# Patient Record
Sex: Male | Born: 1960
Health system: Southern US, Community
[De-identification: ages and names within clinical notes are randomized; demographics above are authoritative.]

## PROBLEM LIST (undated history)

## (undated) DIAGNOSIS — E7800 Pure hypercholesterolemia, unspecified: Secondary | ICD-10-CM

## (undated) DIAGNOSIS — I10 Essential (primary) hypertension: Secondary | ICD-10-CM

## (undated) DIAGNOSIS — K219 Gastro-esophageal reflux disease without esophagitis: Secondary | ICD-10-CM

## (undated) HISTORY — PX: OTHER SURGICAL HISTORY: SHX169

---

## 2006-06-17 ENCOUNTER — Emergency Department (HOSPITAL_COMMUNITY): Admission: EM | Admit: 2006-06-17 | Discharge: 2006-06-17 | Payer: Self-pay | Admitting: Emergency Medicine

## 2006-06-22 ENCOUNTER — Ambulatory Visit (HOSPITAL_COMMUNITY): Admission: RE | Admit: 2006-06-22 | Discharge: 2006-06-22 | Payer: Self-pay | Admitting: Family Medicine

## 2006-06-28 ENCOUNTER — Ambulatory Visit (HOSPITAL_COMMUNITY): Admission: RE | Admit: 2006-06-28 | Discharge: 2006-06-28 | Payer: Self-pay | Admitting: Family Medicine

## 2007-11-12 ENCOUNTER — Ambulatory Visit: Payer: Self-pay | Admitting: Cardiology

## 2009-05-11 DIAGNOSIS — R079 Chest pain, unspecified: Secondary | ICD-10-CM | POA: Insufficient documentation

## 2009-05-11 DIAGNOSIS — E785 Hyperlipidemia, unspecified: Secondary | ICD-10-CM | POA: Insufficient documentation

## 2009-05-11 DIAGNOSIS — I10 Essential (primary) hypertension: Secondary | ICD-10-CM | POA: Insufficient documentation

## 2010-07-08 ENCOUNTER — Ambulatory Visit (HOSPITAL_COMMUNITY): Admission: RE | Admit: 2010-07-08 | Discharge: 2010-07-08 | Payer: Self-pay | Admitting: General Surgery

## 2010-07-13 ENCOUNTER — Emergency Department (HOSPITAL_COMMUNITY): Admission: EM | Admit: 2010-07-13 | Discharge: 2010-07-13 | Payer: Self-pay | Admitting: Emergency Medicine

## 2011-01-05 LAB — BASIC METABOLIC PANEL
Calcium: 8.9 mg/dL (ref 8.4–10.5)
GFR calc non Af Amer: >60 mL/min (ref >60)
Glucose, Bld: 112 mg/dL — ABNORMAL HIGH (ref 70–99)
Potassium: 4.5 mEq/L (ref 3.5–5.1)

## 2011-01-05 LAB — CBC
HCT: 45.2 % (ref 39.0–52.0)
Hemoglobin: 15.5 g/dL (ref 13.0–17.0)
MCV: 89.9 fL (ref 78.0–100.0)
Platelets: 235 10*3/uL (ref 150–400)
RDW: 13.2 % (ref 11.5–15.5)

## 2011-01-05 LAB — SURGICAL PCR SCREEN: MRSA, PCR: NEGATIVE

## 2011-03-07 NOTE — Letter (Signed)
November 12, 2007    W. Simone Curia, M.D.  [address]  [place],  [place] [address]   RE:  Arthur Galvan, Arthur Galvan  MRN:  [id number]  /  DOB:  May 17, 1961   Dear Arthur Galvan:   It was my pleasure evaluating Arthur Galvan in consultation in the office  today at your request.  As you know, this nice gentleman has enjoyed  generally good health.  He has mild hypertension and dyslipidemia that  have been well treated.  For the past year or two, he has noted  intermittent chest tightness.  This may be related to isometric effort,  but is not clearly caused by exercise.  There has been no associated  dyspnea, diaphoresis nor nausea.  Symptoms may last as long as an entire  day.  There is associated chest wall tenderness.  He has recently tried  ibuprofen, at your request, with apparently good results.   He has no known vascular disease.  He has never previously been  evaluated by a cardiologist.  Risk factors in addition to hypertension  and hyperlipidemia include a positive family history.  His father is  deceased, and had heart disease, but died of pancreatitis.  There was no  premature onset.  He has four siblings, none of whom have significant  heart or blood vessel disease.  He is a cigarette smoker with a total  consumption of 40 pack-years.   Current medications include amlodipine 5 mg daily and atorvastatin 20 mg  daily.  HE REPORTS AN ALLERGY TO AN ANTIBIOTIC, BUT HE DOES NOT KNOW  WHICH ONE.   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 No excessive use of alcohol.  Married with two adult children.   REVIEW OF SYSTEMS:  It is entirely negative.  He does not routinely take  vaccinations.   On exam, pleasant, healthy-appearing, stocky gentleman in no acute  distress.  The weight is 248 and blood pressure 122/78, heart rate 86  and regular.  HEENT:  Anicteric sclerae; normal lids and conjunctivae; normal oral  mucosa.  NECK:  No jugular venous  distention; normal carotid upstrokes without  bruits.  ENDOCRINE:  No thyromegaly.  HEMATOPOIETIC:  No adenopathy.  LUNGS:  Clear.  CARDIAC:  Normal first and second heart sounds; normal PMI.  ABDOMEN:  Soft and nontender; no organomegaly.  EXTREMITIES:  No edema; normal distal pulses.  NEUROMUSCULAR:  Symmetric strength and tone; normal cranial nerves.  SKIN:  Multiple skin tags, particularly around the neck; minor psoriatic  lesions of the left elbow.   EKG:  Normal sinus rhythm; within normal limits.  No prior tracing for  comparison.   Recent laboratory in your office includes a normal chemistry profile,  and somewhat suboptimal lipid profile with that total cholesterol of  162, triglycerides of 202, HDL of 28 and LDL of 94 and normal liver  profile.   IMPRESSION:  Arthur Galvan has atypical chest pain bordering noncardiac  chest pain.  With muscle tenderness and a response to nonsteroidals,  relationship to muscular activity but not aerobic activity, a fairly  recent negative stress test and a low 10-year risk of cardiovascular  disease of 13%, I believe it quite unlikely that his symptoms reflect  myocardial ischemia.  I do not think performing a test that exposes this  relatively young gentleman to significant radiation is warranted.  We  discussed the possibility of stress echocardiogram, but I do not think  this would contribute much more than his regular stress test.  He  appears to be reassured by my opinion.  I cautioned him that should his  symptoms change or worsen, he would need to be reassessed, perhaps  urgently.  I will not plan to see this nice gentleman routinely, but  would be happy to reevaluate him should the need arise.  Thank so much  for allowing him to see me.    Sincerely,      Gerrit Friends. Dietrich Pates, MD, [facility]  Electronically Signed    RMR/MedQ  DD: 11/12/2007  DT: 11/12/2007  Job #: [id number]

## 2013-07-16 ENCOUNTER — Encounter: Payer: Self-pay | Admitting: Family Medicine

## 2013-07-16 ENCOUNTER — Ambulatory Visit (INDEPENDENT_AMBULATORY_CARE_PROVIDER_SITE_OTHER): Payer: BC Managed Care – PPO | Admitting: Family Medicine

## 2013-07-16 VITALS — BP 128/84 | Ht 69.0 in | Wt 273.0 lb

## 2013-07-16 DIAGNOSIS — Z79899 Other long term (current) drug therapy: Secondary | ICD-10-CM

## 2013-07-16 DIAGNOSIS — I10 Essential (primary) hypertension: Secondary | ICD-10-CM

## 2013-07-16 DIAGNOSIS — R7301 Impaired fasting glucose: Secondary | ICD-10-CM

## 2013-07-16 DIAGNOSIS — J683 Other acute and subacute respiratory conditions due to chemicals, gases, fumes and vapors: Secondary | ICD-10-CM

## 2013-07-16 DIAGNOSIS — E785 Hyperlipidemia, unspecified: Secondary | ICD-10-CM

## 2013-07-16 DIAGNOSIS — Z23 Encounter for immunization: Secondary | ICD-10-CM

## 2013-07-16 DIAGNOSIS — E782 Mixed hyperlipidemia: Secondary | ICD-10-CM

## 2013-07-16 DIAGNOSIS — J45909 Unspecified asthma, uncomplicated: Secondary | ICD-10-CM

## 2013-07-16 NOTE — Progress Notes (Signed)
  Subjective:    Patient ID: Arthur Galvan, male    DOB: December 27, 1960, 52 y.o.   MRN: [id number]  Hypertension This is a chronic problem. The current episode started more than 1 year ago. The problem has been gradually improving since onset. The problem is controlled. There are no associated agents to hypertension. There are no known risk factors for coronary artery disease. Treatments tried: amlodipine. The current treatment provides significant improvement. There are no compliance problems.   Patient states he also needs a tetanus shot today. Patient states he has no other concerns today.   Taking all his meds  exerise is so so . Staying busy at work. Fair amnt of walking  No sig wheezing. Normally takes claritin--seems to make hyper.       Review of Systems No chest pain no back pain no abdominal pain no change in bowel habits ROS otherwise than    Objective:   Physical Exam Alert no apparent distress. Lungs clear. Heart regular in rhythm. H&T normal. Blood pressure good on repeat. Ankles without edema.      Assessment & Plan:  Impression #1 hypertension good control. #2 hyperlipidemia status uncertain. #3 chronic smoker with history of reactive airways clinically stable at this time. Plan maintain same blood pressure medicine. Diet exercise discussed in encourage. Encouraged to quit smoking. Tetanus shot today. Flu shot today. Appropriate blood work. Further recommendations based on results. WSL

## 2013-07-19 LAB — BASIC METABOLIC PANEL
Calcium: 9.3 mg/dL (ref 8.4–10.5)
Potassium: 4.6 mEq/L (ref 3.5–5.3)
Sodium: 140 mEq/L (ref 135–145)

## 2013-07-19 LAB — LIPID PANEL
Cholesterol: 152 mg/dL (ref 0–200)
HDL: 28 mg/dL — ABNORMAL LOW (ref >39)
Total CHOL/HDL Ratio: 5.4 Ratio
VLDL: 32 mg/dL (ref 0–40)

## 2013-07-19 LAB — HEPATIC FUNCTION PANEL
ALT: 23 U/L (ref 0–53)
AST: 18 U/L (ref 0–37)
Indirect Bilirubin: 0.6 mg/dL (ref 0.0–0.9)
Total Bilirubin: 0.7 mg/dL (ref 0.3–1.2)
Total Protein: 7 g/dL (ref 6.0–8.3)

## 2013-08-03 ENCOUNTER — Encounter: Payer: Self-pay | Admitting: Family Medicine

## 2013-09-19 ENCOUNTER — Other Ambulatory Visit: Payer: Self-pay | Admitting: Family Medicine

## 2013-12-29 ENCOUNTER — Other Ambulatory Visit: Payer: Self-pay | Admitting: Family Medicine

## 2014-02-20 ENCOUNTER — Other Ambulatory Visit: Payer: Self-pay | Admitting: Family Medicine

## 2014-03-10 ENCOUNTER — Ambulatory Visit (INDEPENDENT_AMBULATORY_CARE_PROVIDER_SITE_OTHER): Payer: BC Managed Care – PPO | Admitting: Family Medicine

## 2014-03-10 ENCOUNTER — Encounter: Payer: Self-pay | Admitting: Family Medicine

## 2014-03-10 VITALS — BP 144/90 | Ht 69.0 in | Wt 277.0 lb

## 2014-03-10 DIAGNOSIS — Z125 Encounter for screening for malignant neoplasm of prostate: Secondary | ICD-10-CM

## 2014-03-10 DIAGNOSIS — I10 Essential (primary) hypertension: Secondary | ICD-10-CM

## 2014-03-10 DIAGNOSIS — R7301 Impaired fasting glucose: Secondary | ICD-10-CM

## 2014-03-10 DIAGNOSIS — E785 Hyperlipidemia, unspecified: Secondary | ICD-10-CM

## 2014-03-10 DIAGNOSIS — Z79899 Other long term (current) drug therapy: Secondary | ICD-10-CM

## 2014-03-10 MED ORDER — TADALAFIL 20 MG PO TABS: ORAL_TABLET | ORAL | Status: DC

## 2014-03-10 MED ORDER — DICLOFENAC SODIUM 75 MG PO TBEC: 75.0000 mg | DELAYED_RELEASE_TABLET | Freq: Two times a day (BID) | ORAL | Status: DC

## 2014-03-10 MED ORDER — PRAVASTATIN SODIUM 80 MG PO TABS: ORAL_TABLET | ORAL | Status: DC

## 2014-03-10 MED ORDER — FENOFIBRATE 160 MG PO TABS
ORAL_TABLET | ORAL | Status: DC
Start: 2014-03-10 — End: 2014-12-31

## 2014-03-10 MED ORDER — AMLODIPINE BESYLATE 5 MG PO TABS: ORAL_TABLET | ORAL | Status: DC

## 2014-03-10 NOTE — Progress Notes (Signed)
   Subjective:    Patient ID: Arthur Galvan, male    DOB: [birth year], 53 y.o.   MRN: [id number]  HPI Patient is here today for a med check.  He needs a refill on his meds.  Pt c/o his thumbs hurting for 2 months. He takes Tylenol for pain. Right hurts worse, achey and uncomfortable  Grades diet so so. Has cut down salt  Sweets still sig but better  Fat intake down.  Breathing some wheezing with exertion. Haven't used inhaler    Pt also said his elbows swell? This has been going on for 2 years now.  Stays active,   Unfortunately still smoking Review of Systems Chest pain no back pain no abdominal pain no change in bowel habits no blood in stool ROS otherwise negative    Objective:   Physical Exam Alert no apparent distress. H&T normal. Blood pressure good on repeat 128/82. Lungs clear. Heart regular in rhythm. Elbows full pain and bursitis bilateral. Some some pain with flexion Heberden's nodes noted on hand.       Assessment & Plan:  Impression #1 hypertension good control. #2 hyperlipidemia status uncertain discuss. #3 erectile dysfunction discussed patient would like to try something else. #4 arthritis/tendinitis of hands plan trial Voltaren twice a day with food. Cialis 20 mg when necessary for erections. Symptomatic care discussed. Physical exam next month. Chronic medicines refilled. Encouraged to stop smoking. WSL

## 2014-03-14 LAB — HEPATIC FUNCTION PANEL
ALK PHOS: 79 U/L (ref 39–117)
ALT: 26 U/L (ref 0–53)
AST: 18 U/L (ref 0–37)
Albumin: 3.9 g/dL (ref 3.5–5.2)
BILIRUBIN INDIRECT: 0.6 mg/dL (ref 0.2–1.2)
Bilirubin, Direct: 0.2 mg/dL (ref 0.0–0.3)
TOTAL PROTEIN: 6.6 g/dL (ref 6.0–8.3)
Total Bilirubin: 0.8 mg/dL (ref 0.2–1.2)

## 2014-03-14 LAB — BASIC METABOLIC PANEL
BUN: 13 mg/dL (ref 6–23)
CHLORIDE: 105 meq/L (ref 96–112)
CO2: 26 mEq/L (ref 19–32)
CREATININE: 0.68 mg/dL (ref 0.50–1.35)
Calcium: 8.9 mg/dL (ref 8.4–10.5)
Glucose, Bld: 106 mg/dL — ABNORMAL HIGH (ref 70–99)
Potassium: 4.6 mEq/L (ref 3.5–5.3)
SODIUM: 139 meq/L (ref 135–145)

## 2014-03-14 LAB — LIPID PANEL
Cholesterol: 124 mg/dL (ref 0–200)
HDL: 27 mg/dL — AB (ref >39)
LDL CALC: 70 mg/dL (ref 0–99)
TRIGLYCERIDES: 137 mg/dL (ref <150)
Total CHOL/HDL Ratio: 4.6 Ratio
VLDL: 27 mg/dL (ref 0–40)

## 2014-03-16 LAB — PSA: PSA: 2.66 ng/mL (ref <=4.00)

## 2014-04-13 ENCOUNTER — Encounter: Payer: Self-pay | Admitting: Family Medicine

## 2014-04-13 ENCOUNTER — Ambulatory Visit (INDEPENDENT_AMBULATORY_CARE_PROVIDER_SITE_OTHER): Payer: BC Managed Care – PPO | Admitting: Family Medicine

## 2014-04-13 VITALS — BP 134/80 | Ht 69.0 in | Wt 277.1 lb

## 2014-04-13 DIAGNOSIS — Z0000 Encounter for general adult medical examination without abnormal findings: Secondary | ICD-10-CM

## 2014-04-13 DIAGNOSIS — Z23 Encounter for immunization: Secondary | ICD-10-CM

## 2014-04-13 MED ORDER — DOXYCYCLINE HYCLATE 100 MG PO TABS: 100.0000 mg | ORAL_TABLET | Freq: Two times a day (BID) | ORAL | Status: DC

## 2014-04-13 MED ORDER — BUPROPION HCL ER (SR) 150 MG PO TB12: ORAL_TABLET | ORAL | Status: DC

## 2014-04-13 NOTE — Progress Notes (Signed)
Subjective:    Patient ID: Arthur Galvan, male    DOB: May 21, 1961, 53 y.o.   MRN: [id number]  HPI The patient comes in today for a wellness visit.    A review of their health history was completed.  A review of medications was also completed.  Any needed refills; none  Eating habits: getting better  Falls/  MVA accidents in past few months: none  Regular exercise: no. Not much, a lot with the job  No colonoscopy yet  Smoking around a pack and a half per day. First started smoking at age 89.quit for two mo's. chantix got some "weird thoughtds" at full dose.  Results for orders placed in visit on 03/10/14  HEPATIC FUNCTION PANEL      Result Value Ref Range   Total Bilirubin 0.8  0.2 - 1.2 mg/dL   Bilirubin, Direct 0.2  0.0 - 0.3 mg/dL   Indirect Bilirubin 0.6  0.2 - 1.2 mg/dL   Alkaline Phosphatase 79  39 - 117 U/L   AST 18  0 - 37 U/L   ALT 26  0 - 53 U/L   Total Protein 6.6  6.0 - 8.3 g/dL   Albumin 3.9  3.5 - 5.2 g/dL  LIPID PANEL      Result Value Ref Range   Cholesterol 124  0 - 200 mg/dL   Triglycerides 137  <150 mg/dL   HDL 27 (*) >39 mg/dL   Total CHOL/HDL Ratio 4.6     VLDL 27  0 - 40 mg/dL   LDL Cholesterol 70  0 - 99 mg/dL  BASIC METABOLIC PANEL      Result Value Ref Range   Sodium 139  135 - 145 mEq/L   Potassium 4.6  3.5 - 5.3 mEq/L   Chloride 105  96 - 112 mEq/L   CO2 26  19 - 32 mEq/L   Glucose, Bld 106 (*) 70 - 99 mg/dL   BUN 13  6 - 23 mg/dL   Creat 0.68  0.50 - 1.35 mg/dL   Calcium 8.9  8.4 - 10.5 mg/dL  PSA      Result Value Ref Range   PSA 2.66  <=4.00 ng/mL     Specialist pt sees on regular basis: none  Preventative health issues were discussed.   Additional concerns: Patient states he gets boils on his legs from time to time and he needs an antibiotic.   Has a mole on his chest also. Has many skin tags   Review of Systems  Constitutional: Negative for fever, activity change and appetite change.       Gradual weight gain  through the years.  HENT: Negative for congestion and rhinorrhea.   Eyes: Negative for discharge.  Respiratory: Negative for cough and wheezing.   Cardiovascular: Positive for leg swelling. Negative for chest pain.       Mild ankle swelling particularly when standing long periods  Gastrointestinal: Negative for vomiting, abdominal pain and blood in stool.  Genitourinary: Negative for frequency and difficulty urinating.  Musculoskeletal: Negative for neck pain.  Skin: Negative for rash.  Allergic/Immunologic: Negative for environmental allergies and food allergies.  Neurological: Negative for weakness and headaches.  Psychiatric/Behavioral: Negative for agitation.  All other systems reviewed and are negative.      Objective:   Physical Exam  Vitals reviewed. Constitutional: He appears well-developed and well-nourished.  Substantial obesity present.  HENT:  Head: Normocephalic and atraumatic.  Right Ear: External ear normal.  Left  Ear: External ear normal.  Nose: Nose normal.  Mouth/Throat: Oropharynx is clear and moist.  Eyes: EOM are normal. Pupils are equal, round, and reactive to light.  Neck: Normal range of motion. Neck supple. No thyromegaly present.  Cardiovascular: Normal rate, regular rhythm and normal heart sounds.   No murmur heard. Pulmonary/Chest: Effort normal and breath sounds normal. No respiratory distress. He has no wheezes.  Abdominal: Soft. Bowel sounds are normal. He exhibits no distension and no mass. There is no tenderness.  Genitourinary: Penis normal.  Musculoskeletal: Normal range of motion. He exhibits edema.  Trace edema  Lymphadenopathy:    He has no cervical adenopathy.  Neurological: He is alert. He exhibits normal muscle tone.  Skin: Skin is warm and dry. No erythema.  Multiple small boils around the groin  Psychiatric: He has a normal mood and affect. His behavior is normal. Judgment normal.          Assessment & Plan:  Impression 1  wellness exam. #2 smoking discussed. Time to stop. #3 skin boils discussed. #4 skin lesion benign reassured #5 obesity discussed #6 chronic illnesses see last months no plan Wellbutrin appropriate dose. Proper use discussed. Colonoscopy sheet given. Pneumonia vaccine given. Check in 6 months. Diet exercise discussed in encourage to [facility]

## 2014-05-29 ENCOUNTER — Encounter (INDEPENDENT_AMBULATORY_CARE_PROVIDER_SITE_OTHER): Payer: Self-pay | Admitting: *Deleted

## 2014-05-29 ENCOUNTER — Other Ambulatory Visit (INDEPENDENT_AMBULATORY_CARE_PROVIDER_SITE_OTHER): Payer: Self-pay | Admitting: *Deleted

## 2014-05-29 DIAGNOSIS — Z1211 Encounter for screening for malignant neoplasm of colon: Secondary | ICD-10-CM

## 2014-06-16 ENCOUNTER — Other Ambulatory Visit: Payer: Self-pay | Admitting: Family Medicine

## 2014-06-24 ENCOUNTER — Telehealth (INDEPENDENT_AMBULATORY_CARE_PROVIDER_SITE_OTHER): Payer: Self-pay | Admitting: *Deleted

## 2014-06-24 DIAGNOSIS — Z1211 Encounter for screening for malignant neoplasm of colon: Secondary | ICD-10-CM

## 2014-06-24 NOTE — Telephone Encounter (Signed)
Patient needs movi prep 

## 2014-06-26 MED ORDER — PEG-KCL-NACL-NASULF-NA ASC-C 100 G PO SOLR: 1.0000 | Freq: Once | ORAL | Status: DC

## 2014-07-13 ENCOUNTER — Other Ambulatory Visit: Payer: Self-pay | Admitting: Family Medicine

## 2014-07-17 ENCOUNTER — Telehealth (INDEPENDENT_AMBULATORY_CARE_PROVIDER_SITE_OTHER): Payer: Self-pay | Admitting: *Deleted

## 2014-07-17 NOTE — Telephone Encounter (Signed)
Referring MD/PCP: steve luking   Procedure: tcs  Reason/Indication:  screening  Has patient had this procedure before?  no  If so, when, by whom and where?    Is there a family history of colon cancer?  no  Who?  What age when diagnosed?    Is patient diabetic?   no      Does patient have prosthetic heart valve?  no  Do you have a pacemaker?  no  Has patient ever had endocarditis? no  Has patient had joint replacement within last 12 months?  no  Does patient tend to be constipated or take laxatives? no  Is patient on Coumadin, Plavix and/or Aspirin? no  Medications: amlodipine 5 mg daily, diclofenac 75 mg bid, pravastatin 80 mg daily, welbutrin 150 mg bid, fenofibrate 160 mg daily  Allergies: nkda  Medication Adjustment:   Procedure date & time: 08/13/14 at 930

## 2014-07-20 NOTE — Telephone Encounter (Signed)
agree

## 2014-08-13 ENCOUNTER — Encounter (HOSPITAL_COMMUNITY)
Admission: RE | Disposition: A | Discharge status: Home / Self Care | Payer: Self-pay | Source: Ambulatory Visit | Attending: Internal Medicine

## 2014-08-13 ENCOUNTER — Encounter (HOSPITAL_COMMUNITY): Payer: Self-pay | Admitting: *Deleted

## 2014-08-13 ENCOUNTER — Other Ambulatory Visit: Payer: Self-pay | Admitting: Family Medicine

## 2014-08-13 ENCOUNTER — Ambulatory Visit (HOSPITAL_COMMUNITY)
Admission: RE | Admit: 2014-08-13 | Discharge: 2014-08-13 | Disposition: A | Discharge status: Home / Self Care | Payer: BC Managed Care – PPO | Source: Ambulatory Visit | Attending: Internal Medicine | Admitting: Internal Medicine

## 2014-08-13 DIAGNOSIS — E780 Pure hypercholesterolemia: Secondary | ICD-10-CM | POA: Insufficient documentation

## 2014-08-13 DIAGNOSIS — K219 Gastro-esophageal reflux disease without esophagitis: Secondary | ICD-10-CM | POA: Insufficient documentation

## 2014-08-13 DIAGNOSIS — F17210 Nicotine dependence, cigarettes, uncomplicated: Secondary | ICD-10-CM | POA: Insufficient documentation

## 2014-08-13 DIAGNOSIS — K644 Residual hemorrhoidal skin tags: Secondary | ICD-10-CM | POA: Insufficient documentation

## 2014-08-13 DIAGNOSIS — Z791 Long term (current) use of non-steroidal anti-inflammatories (NSAID): Secondary | ICD-10-CM | POA: Insufficient documentation

## 2014-08-13 DIAGNOSIS — I10 Essential (primary) hypertension: Secondary | ICD-10-CM | POA: Insufficient documentation

## 2014-08-13 DIAGNOSIS — D125 Benign neoplasm of sigmoid colon: Secondary | ICD-10-CM | POA: Diagnosis not present

## 2014-08-13 DIAGNOSIS — Z1211 Encounter for screening for malignant neoplasm of colon: Secondary | ICD-10-CM | POA: Insufficient documentation

## 2014-08-13 DIAGNOSIS — Z79899 Other long term (current) drug therapy: Secondary | ICD-10-CM | POA: Diagnosis not present

## 2014-08-13 DIAGNOSIS — K649 Unspecified hemorrhoids: Secondary | ICD-10-CM

## 2014-08-13 HISTORY — PX: COLONOSCOPY: SHX5424

## 2014-08-13 HISTORY — DX: Pure hypercholesterolemia, unspecified: E78.00

## 2014-08-13 HISTORY — DX: Gastro-esophageal reflux disease without esophagitis: K21.9

## 2014-08-13 HISTORY — DX: Essential (primary) hypertension: I10

## 2014-08-13 SURGERY — COLONOSCOPY
Anesthesia: Moderate Sedation

## 2014-08-13 MED ORDER — STERILE WATER FOR IRRIGATION IR SOLN
Status: DC | PRN
  Administered 2014-08-13: 09:00:00

## 2014-08-13 MED ORDER — MEPERIDINE HCL 50 MG/ML IJ SOLN
INTRAMUSCULAR | Status: AC
  Filled 2014-08-13: qty 1

## 2014-08-13 MED ORDER — MIDAZOLAM HCL 5 MG/5ML IJ SOLN
INTRAMUSCULAR | Status: AC
  Filled 2014-08-13: qty 10

## 2014-08-13 MED ORDER — MEPERIDINE HCL 50 MG/ML IJ SOLN
INTRAMUSCULAR | Status: DC | PRN
  Administered 2014-08-13 (×2): 25 mg via INTRAVENOUS

## 2014-08-13 MED ORDER — SODIUM CHLORIDE 0.9 % IV SOLN
INTRAVENOUS | Status: DC
  Administered 2014-08-13: 09:00:00 via INTRAVENOUS

## 2014-08-13 MED ORDER — MIDAZOLAM HCL 5 MG/5ML IJ SOLN
INTRAMUSCULAR | Status: DC | PRN
  Administered 2014-08-13 (×5): 2 mg via INTRAVENOUS

## 2014-08-13 NOTE — H&P (Signed)
Arthur Galvan is an 53 y.o. male.   Chief Complaint: Patient's here for colonoscopy. HPI: Patient is 53 year old Caucasian male who is here for screening colonoscopy. He denies abdominal pain change in bowel habits or rectal bleeding. Family history is negative for CRC.  Past Medical History  Diagnosis Date  . Hypertension   . Hypercholesteremia   . GERD (gastroesophageal reflux disease)     Past Surgical History  Procedure Laterality Date  . Abscess removed from groin      Family History  Problem Relation Age of Onset  . Colon cancer Neg Hx    Social History:  reports that he has been smoking Cigarettes.  He has a 20 pack-year smoking history. He does not have any smokeless tobacco history on file. He reports that he does not drink alcohol or use illicit drugs.  Allergies: No Known Allergies  Medications Prior to Admission  Medication Sig Dispense Refill  . amLODipine (NORVASC) 5 MG tablet TAKE ONE TABLET EVERY MORNING  30 tablet  5  . buPROPion (WELLBUTRIN SR) 150 MG 12 hr tablet TAKE ONE TABLET EVERY MORNING FOR 3 DAYS; THEN TAKE ONE TABLET TWICE A DAY THEREAFTER  60 tablet  3  . diclofenac (VOLTAREN) 75 MG EC tablet TAKE ONE TABLET BY MOUTH TWICE A DAY. TAKE WITH FOOD.  28 tablet  2  . peg 3350 powder (MOVIPREP) 100 G SOLR Take 1 kit (200 g total) by mouth once.  1 kit  0  . pravastatin (PRAVACHOL) 80 MG tablet TAKE ONE (1) TABLET AT BEDTIME  30 tablet  5  . doxycycline (VIBRA-TABS) 100 MG tablet Take 1 tablet (100 mg total) by mouth 2 (two) times daily. For 10 days  20 tablet  0  . fenofibrate 160 MG tablet TAKE ONE TABLET DAILY AT BEDTIME  30 tablet  5  . tadalafil (CIALIS) 20 MG tablet 1 tab 2 hrs prior to sex  8 tablet  0    No results found for this or any previous visit (from the past 48 hour(s)). No results found.  ROS  Blood pressure 159/95, pulse 86, temperature 97.6 F (36.4 C), temperature source Oral, resp. rate 16, height 5' 9"  (1.753 m), weight 274 lb  (124.286 kg), SpO2 97.00%. Physical Exam  Constitutional: He appears well-developed and well-nourished.  HENT:  Mouth/Throat: Oropharynx is clear and moist.  Eyes: Conjunctivae are normal. No scleral icterus.  Neck: No thyromegaly present.  Cardiovascular: Normal rate, regular rhythm and normal heart sounds.   No murmur heard. Respiratory: Effort normal and breath sounds normal.  GI: Soft. He exhibits no distension and no mass. There is no tenderness.  Musculoskeletal: He exhibits no edema.  Lymphadenopathy:    He has no cervical adenopathy.  Neurological: He is alert.  Skin: Skin is warm and dry.     Assessment/Plan Average risk screening colonoscopy.  REHMAN,NAJEEB U 08/13/2014, 9:11 AM

## 2014-08-13 NOTE — Discharge Instructions (Signed)
Resume usual medications but no aspirin or NSAIDs for 1 week. Resume usual diet. No driving for 24 hours. Physician will call with biopsy results.     Colon Polyps Polyps are lumps of extra tissue growing inside the body. Polyps can grow in the large intestine (colon). Most colon polyps are noncancerous (benign). However, some colon polyps can become cancerous over time. Polyps that are larger than a pea may be harmful. To be safe, caregivers remove and test all polyps. CAUSES  Polyps form when mutations in the genes cause your cells to grow and divide even though no more tissue is needed. RISK FACTORS There are a number of risk factors that can increase your chances of getting colon polyps. They include:  Being older than 50 years.  Family history of colon polyps or colon cancer.  Long-term colon diseases, such as colitis or Crohn disease.  Being overweight.  Smoking.  Being inactive.  Drinking too much alcohol. SYMPTOMS  Most small polyps do not cause symptoms. If symptoms are present, they may include:  Blood in the stool. The stool may look dark red or black.  Constipation or diarrhea that lasts longer than 1 week. DIAGNOSIS People often do not know they have polyps until their caregiver finds them during a regular checkup. Your caregiver can use 4 tests to check for polyps:  Digital rectal exam. The caregiver wears gloves and feels inside the rectum. This test would find polyps only in the rectum.  Barium enema. The caregiver puts a liquid called barium into your rectum before taking X-rays of your colon. Barium makes your colon look white. Polyps are dark, so they are easy to see in the X-ray pictures.  Sigmoidoscopy. A thin, flexible tube (sigmoidoscope) is placed into your rectum. The sigmoidoscope has a light and tiny camera in it. The caregiver uses the sigmoidoscope to look at the last third of your colon.  Colonoscopy. This test is like sigmoidoscopy, but the  caregiver looks at the entire colon. This is the most common method for finding and removing polyps. TREATMENT  Any polyps will be removed during a sigmoidoscopy or colonoscopy. The polyps are then tested for cancer. PREVENTION  To help lower your risk of getting more colon polyps:  Eat plenty of fruits and vegetables. Avoid eating fatty foods.  Do not smoke.  Avoid drinking alcohol.  Exercise every day.  Lose weight if recommended by your caregiver.  Eat plenty of calcium and folate. Foods that are rich in calcium include milk, cheese, and broccoli. Foods that are rich in folate include chickpeas, kidney beans, and spinach. HOME CARE INSTRUCTIONS Keep all follow-up appointments as directed by your caregiver. You may need periodic exams to check for polyps. SEEK MEDICAL CARE IF: You notice bleeding during a bowel movement. Document Released: 07/05/2004 Document Revised: 01/01/2012 Document Reviewed: 12/19/2011 [facility] Patient Information 2015 [place], [place]. This information is not intended to replace advice given to you by your health care provider. Make sure you discuss any questions you have with your health care provider.

## 2014-08-13 NOTE — Op Note (Addendum)
COLONOSCOPY PROCEDURE REPORT  PATIENT:  [name]  MR#:  [id number] Birthdate:  1961/05/28, 53 y.o., male Endoscopist:  Dr. Rogene Houston, MD Referred By:  Dr. Grace Bushy. Arthur Phoenix, MD Procedure Date: 08/13/2014  Procedure:   Colonoscopy with snare polypectomy.  Indications:  Patient is 53 year old Caucasian male who is undergoing average risk screening colonoscopy.  Informed Consent:  The procedure and risks were reviewed with the patient and informed consent was obtained.  Medications:  Demerol 50 mg IV Versed 10 mg IV  Description of procedure:  After a digital rectal exam was performed, that colonoscope was advanced from the anus through the rectum and colon to the area of the cecum, ileocecal valve and appendiceal orifice. The cecum was deeply intubated. These structures were well-seen and photographed for the record. From the level of the cecum and ileocecal valve, the scope was slowly and cautiously withdrawn. The mucosal surfaces were carefully surveyed utilizing scope tip to flexion to facilitate fold flattening as needed. The scope was pulled down into the rectum where a thorough exam including retroflexion was performed.  Findings:  Prep satisfactory. Redundant colon. 10 mm pedunculated polyp hot snare from the distal sigmoid colon. Small polyp cold snare from distal sigmoid colon. Both of these polyps were submitted together. Normal rectal mucosa. Small hemorrhoids below the dentate line.   Therapeutic/Diagnostic Maneuvers Performed:  See above  Complications:  None  Cecal Withdrawal Time:  14 minutes  Impression:  Examination performed to cecum. 10 mm pedunculated polyp snared from distal sigmoid colon and submitted along with another small polyp which was cold snared. It was located close to the larger polyp.  Small external hemorrhoids.  Comment; Large pedunculated polyp did not appear to have features of an adenoma may be lipomatous  polyp.  Recommendations:  Standard instructions given. No aspirin or NSAIDs for 1 week. I will contact patient with biopsy results and further recommendations.  REHMAN,NAJEEB U  08/13/2014 9:55 AM  CC: Dr. Rubbie Battiest, MD & Dr. Rayne Du ref. provider found

## 2014-08-17 ENCOUNTER — Encounter (HOSPITAL_COMMUNITY): Payer: Self-pay | Admitting: Internal Medicine

## 2014-08-24 ENCOUNTER — Telehealth: Payer: Self-pay | Admitting: Family Medicine

## 2014-08-24 NOTE — Telephone Encounter (Signed)
Pt's CIALIS 20 MG tablet was DENIED, criteria not met, please see denial in green folder, please advise

## 2014-09-08 ENCOUNTER — Other Ambulatory Visit: Payer: Self-pay | Admitting: Family Medicine

## 2014-11-25 ENCOUNTER — Other Ambulatory Visit: Payer: Self-pay | Admitting: Family Medicine

## 2014-11-25 NOTE — Telephone Encounter (Signed)
Last seen 04/13/14

## 2014-12-07 ENCOUNTER — Other Ambulatory Visit: Payer: Self-pay | Admitting: Family Medicine

## 2014-12-23 ENCOUNTER — Telehealth: Payer: Self-pay | Admitting: Family Medicine

## 2014-12-23 DIAGNOSIS — I10 Essential (primary) hypertension: Secondary | ICD-10-CM

## 2014-12-23 DIAGNOSIS — Z125 Encounter for screening for malignant neoplasm of prostate: Secondary | ICD-10-CM

## 2014-12-23 DIAGNOSIS — E785 Hyperlipidemia, unspecified: Secondary | ICD-10-CM

## 2014-12-23 DIAGNOSIS — Z79899 Other long term (current) drug therapy: Secondary | ICD-10-CM

## 2014-12-23 NOTE — Telephone Encounter (Signed)
Rep same 

## 2014-12-23 NOTE — Telephone Encounter (Signed)
BW orders are in for [facility], but I cannot get in touch with this patient. Please find me a working #.

## 2014-12-23 NOTE — Telephone Encounter (Signed)
Pt is requesting lab orders to be sent in for his upcoming appt on 12/31/2014. Last labs were lipid,hepatic,bmp,psa on 03/14/14

## 2014-12-24 NOTE — Telephone Encounter (Signed)
Patient notified

## 2014-12-31 ENCOUNTER — Ambulatory Visit (INDEPENDENT_AMBULATORY_CARE_PROVIDER_SITE_OTHER): Payer: BLUE CROSS/BLUE SHIELD | Admitting: Family Medicine

## 2014-12-31 ENCOUNTER — Encounter: Payer: Self-pay | Admitting: Family Medicine

## 2014-12-31 VITALS — BP 132/80 | Ht 69.0 in | Wt 291.2 lb

## 2014-12-31 DIAGNOSIS — I10 Essential (primary) hypertension: Secondary | ICD-10-CM

## 2014-12-31 DIAGNOSIS — E785 Hyperlipidemia, unspecified: Secondary | ICD-10-CM | POA: Diagnosis not present

## 2014-12-31 DIAGNOSIS — R7301 Impaired fasting glucose: Secondary | ICD-10-CM

## 2014-12-31 MED ORDER — AMLODIPINE BESYLATE 5 MG PO TABS: 5.0000 mg | ORAL_TABLET | Freq: Every morning | ORAL | Status: DC

## 2014-12-31 MED ORDER — DICLOFENAC SODIUM 75 MG PO TBEC: 75.0000 mg | DELAYED_RELEASE_TABLET | Freq: Two times a day (BID) | ORAL | Status: DC

## 2014-12-31 MED ORDER — FENOFIBRATE 160 MG PO TABS: ORAL_TABLET | ORAL | Status: DC

## 2014-12-31 MED ORDER — PRAVASTATIN SODIUM 80 MG PO TABS: ORAL_TABLET | ORAL | Status: DC

## 2014-12-31 MED ORDER — BUPROPION HCL ER (SR) 150 MG PO TB12: 150.0000 mg | ORAL_TABLET | Freq: Two times a day (BID) | ORAL | Status: DC

## 2014-12-31 NOTE — Progress Notes (Signed)
   Subjective:    Patient ID: Arthur Galvan, male    DOB: [birth year], 54 y.o.   MRN: [id number]  Hypertension This is a chronic problem. The current episode started more than 1 year ago. Risk factors for coronary artery disease include dyslipidemia, male gender and obesity. Treatments tried: norvasc. There are no compliance problems.    Patient states he has swelling in left ankle.some swelling with the ball of the feet. Swells up any time, worse after up all day . 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  Compliant with lipid medication. No obvious side effects.  Diet so so  BP generally runs,good, bp 140 over 80. Compliant with meds.   Unfortunately still smoking but has cut down to half a pack a day Review of Systems    no headache no chest pain no back pain no abdominal pain ongoing chronic knee pain Objective:   Physical Exam  Alert vital stable blood pressure good on repeat HEENT normal left knee crepitations both ankles changes of venous stasis evident. Left ankle slightly greater than right      Assessment & Plan:  Impression 1 hypertension good control #2 venous stasis discussed #3 hyperlipidemia status uncertain. #4 plantar fasciitis discuss #5 smoking discussed plan stop smoking. Medications refilled. Appropriate blood work. WSL

## 2015-01-01 LAB — LIPID PANEL
CHOLESTEROL TOTAL: 154 mg/dL (ref 100–199)
Chol/HDL Ratio: 5.1 ratio units — ABNORMAL HIGH (ref 0.0–5.0)
HDL: 30 mg/dL — ABNORMAL LOW (ref >39)
LDL Calculated: 97 mg/dL (ref 0–99)
Triglycerides: 133 mg/dL (ref 0–149)
VLDL Cholesterol Cal: 27 mg/dL (ref 5–40)

## 2015-01-01 LAB — BASIC METABOLIC PANEL
BUN/Creatinine Ratio: 19 (ref 9–20)
BUN: 16 mg/dL (ref 6–24)
CALCIUM: 9.4 mg/dL (ref 8.7–10.2)
CO2: 20 mmol/L (ref 18–29)
CREATININE: 0.86 mg/dL (ref 0.76–1.27)
Chloride: 101 mmol/L (ref 97–108)
GFR, EST AFRICAN AMERICAN: 114 mL/min/{1.73_m2} (ref >59)
GFR, EST NON AFRICAN AMERICAN: 99 mL/min/{1.73_m2} (ref >59)
Glucose: 149 mg/dL — ABNORMAL HIGH (ref 65–99)
POTASSIUM: 4.8 mmol/L (ref 3.5–5.2)
SODIUM: 142 mmol/L (ref 134–144)

## 2015-01-01 LAB — HEPATIC FUNCTION PANEL
ALT: 29 IU/L (ref 0–44)
AST: 22 IU/L (ref 0–40)
Albumin: 4.6 g/dL (ref 3.5–5.5)
Alkaline Phosphatase: 101 IU/L (ref 39–117)
Bilirubin Total: 0.5 mg/dL (ref 0.0–1.2)
Bilirubin, Direct: 0.14 mg/dL (ref 0.00–0.40)
Total Protein: 6.8 g/dL (ref 6.0–8.5)

## 2015-01-01 LAB — PSA: PSA: 2.1 ng/mL (ref 0.0–4.0)

## 2015-01-21 ENCOUNTER — Encounter: Payer: Self-pay | Admitting: Family Medicine

## 2015-01-21 ENCOUNTER — Ambulatory Visit (INDEPENDENT_AMBULATORY_CARE_PROVIDER_SITE_OTHER): Payer: BLUE CROSS/BLUE SHIELD | Admitting: Family Medicine

## 2015-01-21 VITALS — BP 140/88 | Ht 69.0 in | Wt 287.8 lb

## 2015-01-21 DIAGNOSIS — R7309 Other abnormal glucose: Secondary | ICD-10-CM

## 2015-01-21 DIAGNOSIS — R7303 Prediabetes: Secondary | ICD-10-CM

## 2015-01-21 DIAGNOSIS — R7301 Impaired fasting glucose: Secondary | ICD-10-CM

## 2015-01-21 DIAGNOSIS — I10 Essential (primary) hypertension: Secondary | ICD-10-CM

## 2015-01-21 DIAGNOSIS — E785 Hyperlipidemia, unspecified: Secondary | ICD-10-CM

## 2015-01-21 LAB — POCT GLUCOSE (DEVICE FOR HOME USE): GLUCOSE FASTING, POC: 124 mg/dL — AB (ref 70–99)

## 2015-01-21 LAB — POCT GLYCOSYLATED HEMOGLOBIN (HGB A1C): HEMOGLOBIN A1C: 6.2

## 2015-01-21 NOTE — Progress Notes (Signed)
   Subjective:    Patient ID: Arthur Galvan, male    DOB: November 24, 1960, 54 y.o.   MRN: [id number]  HPI  Results for orders placed or performed in visit on 12/23/14  Lipid panel  Result Value Ref Range   Cholesterol, Total 154 100 - 199 mg/dL   Triglycerides 133 0 - 149 mg/dL   HDL 30 (L) >39 mg/dL   VLDL Cholesterol Cal 27 5 - 40 mg/dL   LDL Calculated 97 0 - 99 mg/dL   Chol/HDL Ratio 5.1 (H) 0.0 - 5.0 ratio units  Hepatic function panel  Result Value Ref Range   Total Protein 6.8 6.0 - 8.5 g/dL   Albumin 4.6 3.5 - 5.5 g/dL   Bilirubin Total 0.5 0.0 - 1.2 mg/dL   Bilirubin, Direct 0.14 0.00 - 0.40 mg/dL   Alkaline Phosphatase 101 39 - 117 IU/L   AST 22 0 - 40 IU/L   ALT 29 0 - 44 IU/L  Basic metabolic panel  Result Value Ref Range   Glucose 149 (H) 65 - 99 mg/dL   BUN 16 6 - 24 mg/dL   Creatinine, Ser 0.86 0.76 - 1.27 mg/dL   GFR calc non Af Amer 99 >59 mL/min/1.73   GFR calc Af Amer 114 >59 mL/min/1.73   BUN/Creatinine Ratio 19 9 - 20   Sodium 142 134 - 144 mmol/L   Potassium 4.8 3.5 - 5.2 mmol/L   Chloride 101 97 - 108 mmol/L   CO2 20 18 - 29 mmol/L   Calcium 9.4 8.7 - 10.2 mg/dL  PSA  Result Value Ref Range   PSA 2.1 0.0 - 4.0 ng/mL   Sugar in drinks and watching diet, fa had adult onset diabetes.  Patient notes poor dietary compliance in recent years. Also exercise is pretty much nonexistent.  There is family history diabetes.  Patient has noted some frequent urination.  Has drank a lot of sugared tea up until lately  Compliant with lipid medications yet  Review of Systems No headache no chest pain no back pain no abdominal pain no change in bowel habits    Objective:   Physical Exam  Alert no acute distress H&T normal. Lungs clear heart regular rhythm ankles without edema      Assessment & Plan:  Impression impaired fasting glucose. A1c today 6.2%. Glucose 124. His glucose a couple weeks ago was 149 fasting. Patient advised if the glucose is been  simply 126 we would've called this type 2 diabetes plan very long discussion held regarding. Weight loss. Cutting simple sugars out. Use of artificial sweeteners. Increase hydration. Exercise. 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 Recheck as scheduled 25 minutes spent most in discussion

## 2015-01-21 NOTE — Patient Instructions (Signed)
You come very close this morning to an actual diagnosis of type 2 diabetes, but i cannot declare it yet  You want to hold off this dx as long as possible, duh  immedicately cut sugars out of your diet  Your  dr Richardson Landry his own sugars have started to inch up little and now all i drink is diet drinks yech but better than diabdtes dx  No sugar in tea   If you could lose ten pounds of fat, that would change how your body metabolizes sugar  Regular walking and weight loss would be helpful

## 2015-03-16 ENCOUNTER — Encounter: Payer: Self-pay | Admitting: Family Medicine

## 2015-03-16 ENCOUNTER — Ambulatory Visit (INDEPENDENT_AMBULATORY_CARE_PROVIDER_SITE_OTHER): Payer: BLUE CROSS/BLUE SHIELD | Admitting: Family Medicine

## 2015-03-16 VITALS — BP 142/90 | Temp 98.6°F | Ht 69.0 in | Wt 274.0 lb

## 2015-03-16 DIAGNOSIS — J329 Chronic sinusitis, unspecified: Secondary | ICD-10-CM | POA: Diagnosis not present

## 2015-03-16 MED ORDER — FENOFIBRATE 160 MG PO TABS: ORAL_TABLET | ORAL | Status: DC

## 2015-03-16 MED ORDER — PRAVASTATIN SODIUM 80 MG PO TABS: ORAL_TABLET | ORAL | Status: DC

## 2015-03-16 MED ORDER — LEVOFLOXACIN 500 MG PO TABS: 500.0000 mg | ORAL_TABLET | Freq: Every day | ORAL | Status: DC

## 2015-03-16 MED ORDER — AMLODIPINE BESYLATE 5 MG PO TABS
5.0000 mg | ORAL_TABLET | Freq: Every morning | ORAL | Status: DC
Start: 2015-03-16 — End: 2019-09-24

## 2015-03-16 NOTE — Progress Notes (Signed)
   Subjective:    Patient ID: Arthur Galvan, male    DOB: [birth year], 54 y.o.   MRN: [id number]  Emesis  This is a new problem. The current episode started yesterday. Associated symptoms include coughing and dizziness. Associated symptoms comments: Nausea, loose stools, green nasal drainage, sinus pressure.   Swimmy headed and dim energy  Prod cough  Not as bad today  Some prod cough, nasal passages all stopped up,   Green yell disch     Review of Systems  Respiratory: Positive for cough.   Gastrointestinal: Positive for vomiting.  Neurological: Positive for dizziness.       Objective:   Physical Exam  Alert good hydration. Vitals stable frontal maxillary tenderness evident pharynx normal neck supple. Lungs clear heart regular in rhythm.      Assessment & Plan:  Impression acute rhinosinusitis plan Levaquin daily 7 days. Symptom care discussed. Hold [place] during that time. WSL

## 2015-07-02 ENCOUNTER — Ambulatory Visit: Payer: BLUE CROSS/BLUE SHIELD | Admitting: Family Medicine

## 2015-07-23 ENCOUNTER — Ambulatory Visit: Payer: BLUE CROSS/BLUE SHIELD | Admitting: Family Medicine

## 2016-01-26 DIAGNOSIS — I10 Essential (primary) hypertension: Secondary | ICD-10-CM | POA: Diagnosis not present

## 2016-02-12 DIAGNOSIS — Z720 Tobacco use: Secondary | ICD-10-CM | POA: Diagnosis not present

## 2016-02-12 DIAGNOSIS — I10 Essential (primary) hypertension: Secondary | ICD-10-CM | POA: Diagnosis not present

## 2016-02-12 DIAGNOSIS — E291 Testicular hypofunction: Secondary | ICD-10-CM | POA: Diagnosis not present

## 2016-03-31 ENCOUNTER — Other Ambulatory Visit: Payer: Self-pay | Admitting: Family Medicine

## 2016-04-24 DIAGNOSIS — E291 Testicular hypofunction: Secondary | ICD-10-CM | POA: Diagnosis not present

## 2016-05-08 ENCOUNTER — Other Ambulatory Visit: Payer: Self-pay | Admitting: Family Medicine

## 2016-05-23 DIAGNOSIS — R7301 Impaired fasting glucose: Secondary | ICD-10-CM | POA: Diagnosis not present

## 2016-05-23 DIAGNOSIS — Z720 Tobacco use: Secondary | ICD-10-CM | POA: Diagnosis not present

## 2016-05-23 DIAGNOSIS — E291 Testicular hypofunction: Secondary | ICD-10-CM | POA: Diagnosis not present

## 2016-05-23 DIAGNOSIS — I10 Essential (primary) hypertension: Secondary | ICD-10-CM | POA: Diagnosis not present

## 2016-06-05 DIAGNOSIS — E291 Testicular hypofunction: Secondary | ICD-10-CM | POA: Diagnosis not present

## 2016-06-09 DIAGNOSIS — E291 Testicular hypofunction: Secondary | ICD-10-CM | POA: Diagnosis not present

## 2016-06-23 DIAGNOSIS — R05 Cough: Secondary | ICD-10-CM | POA: Diagnosis not present

## 2016-06-23 DIAGNOSIS — J0100 Acute maxillary sinusitis, unspecified: Secondary | ICD-10-CM | POA: Diagnosis not present

## 2016-06-30 DIAGNOSIS — E782 Mixed hyperlipidemia: Secondary | ICD-10-CM | POA: Diagnosis not present

## 2016-06-30 DIAGNOSIS — R7301 Impaired fasting glucose: Secondary | ICD-10-CM | POA: Diagnosis not present

## 2016-06-30 DIAGNOSIS — E291 Testicular hypofunction: Secondary | ICD-10-CM | POA: Diagnosis not present

## 2016-07-04 DIAGNOSIS — E782 Mixed hyperlipidemia: Secondary | ICD-10-CM | POA: Diagnosis not present

## 2016-07-04 DIAGNOSIS — Z0001 Encounter for general adult medical examination with abnormal findings: Secondary | ICD-10-CM | POA: Diagnosis not present

## 2016-07-04 DIAGNOSIS — I10 Essential (primary) hypertension: Secondary | ICD-10-CM | POA: Diagnosis not present

## 2016-07-04 DIAGNOSIS — E119 Type 2 diabetes mellitus without complications: Secondary | ICD-10-CM | POA: Diagnosis not present

## 2016-07-13 DIAGNOSIS — D72829 Elevated white blood cell count, unspecified: Secondary | ICD-10-CM | POA: Diagnosis not present

## 2016-07-13 DIAGNOSIS — R718 Other abnormality of red blood cells: Secondary | ICD-10-CM | POA: Diagnosis not present

## 2016-07-25 DIAGNOSIS — E291 Testicular hypofunction: Secondary | ICD-10-CM | POA: Diagnosis not present

## 2016-08-05 DIAGNOSIS — E291 Testicular hypofunction: Secondary | ICD-10-CM | POA: Diagnosis not present

## 2016-08-05 DIAGNOSIS — R7301 Impaired fasting glucose: Secondary | ICD-10-CM | POA: Diagnosis not present

## 2016-08-05 DIAGNOSIS — Z720 Tobacco use: Secondary | ICD-10-CM | POA: Diagnosis not present

## 2016-08-05 DIAGNOSIS — I10 Essential (primary) hypertension: Secondary | ICD-10-CM | POA: Diagnosis not present

## 2016-08-24 DIAGNOSIS — E291 Testicular hypofunction: Secondary | ICD-10-CM | POA: Diagnosis not present

## 2016-09-01 DIAGNOSIS — E291 Testicular hypofunction: Secondary | ICD-10-CM | POA: Diagnosis not present

## 2016-09-18 DIAGNOSIS — E291 Testicular hypofunction: Secondary | ICD-10-CM | POA: Diagnosis not present

## 2016-10-07 DIAGNOSIS — E291 Testicular hypofunction: Secondary | ICD-10-CM | POA: Diagnosis not present

## 2016-10-20 DIAGNOSIS — E291 Testicular hypofunction: Secondary | ICD-10-CM | POA: Diagnosis not present

## 2016-11-04 DIAGNOSIS — E291 Testicular hypofunction: Secondary | ICD-10-CM | POA: Diagnosis not present

## 2016-11-13 DIAGNOSIS — E782 Mixed hyperlipidemia: Secondary | ICD-10-CM | POA: Diagnosis not present

## 2016-11-13 DIAGNOSIS — I10 Essential (primary) hypertension: Secondary | ICD-10-CM | POA: Diagnosis not present

## 2016-11-13 DIAGNOSIS — Z23 Encounter for immunization: Secondary | ICD-10-CM | POA: Diagnosis not present

## 2016-11-13 DIAGNOSIS — E291 Testicular hypofunction: Secondary | ICD-10-CM | POA: Diagnosis not present

## 2016-11-13 DIAGNOSIS — E119 Type 2 diabetes mellitus without complications: Secondary | ICD-10-CM | POA: Diagnosis not present

## 2016-11-20 ENCOUNTER — Other Ambulatory Visit: Payer: Self-pay | Admitting: Family Medicine

## 2016-11-23 ENCOUNTER — Other Ambulatory Visit: Payer: Self-pay | Admitting: Family Medicine

## 2016-11-23 NOTE — Telephone Encounter (Signed)
Whoa not seen for over a year and a half, call pt. If agrees to visit thirty days of med

## 2016-11-23 NOTE — Telephone Encounter (Signed)
Last check up was 01/21/2015. Ok to refill?

## 2016-12-09 DIAGNOSIS — E291 Testicular hypofunction: Secondary | ICD-10-CM | POA: Diagnosis not present

## 2016-12-09 DIAGNOSIS — E119 Type 2 diabetes mellitus without complications: Secondary | ICD-10-CM | POA: Diagnosis not present

## 2016-12-09 DIAGNOSIS — D72829 Elevated white blood cell count, unspecified: Secondary | ICD-10-CM | POA: Diagnosis not present

## 2016-12-09 DIAGNOSIS — Z720 Tobacco use: Secondary | ICD-10-CM | POA: Diagnosis not present

## 2016-12-09 DIAGNOSIS — I10 Essential (primary) hypertension: Secondary | ICD-10-CM | POA: Diagnosis not present

## 2016-12-09 DIAGNOSIS — R718 Other abnormality of red blood cells: Secondary | ICD-10-CM | POA: Diagnosis not present

## 2016-12-09 DIAGNOSIS — R7301 Impaired fasting glucose: Secondary | ICD-10-CM | POA: Diagnosis not present

## 2016-12-09 DIAGNOSIS — D45 Polycythemia vera: Secondary | ICD-10-CM | POA: Diagnosis not present

## 2017-01-15 DIAGNOSIS — J0100 Acute maxillary sinusitis, unspecified: Secondary | ICD-10-CM | POA: Diagnosis not present

## 2017-03-14 DIAGNOSIS — D751 Secondary polycythemia: Secondary | ICD-10-CM | POA: Diagnosis not present

## 2017-03-14 DIAGNOSIS — K219 Gastro-esophageal reflux disease without esophagitis: Secondary | ICD-10-CM | POA: Diagnosis not present

## 2017-03-14 DIAGNOSIS — E119 Type 2 diabetes mellitus without complications: Secondary | ICD-10-CM | POA: Diagnosis not present

## 2017-03-14 DIAGNOSIS — R5383 Other fatigue: Secondary | ICD-10-CM | POA: Diagnosis not present

## 2017-04-02 DIAGNOSIS — E782 Mixed hyperlipidemia: Secondary | ICD-10-CM | POA: Diagnosis not present

## 2017-04-02 DIAGNOSIS — E291 Testicular hypofunction: Secondary | ICD-10-CM | POA: Diagnosis not present

## 2017-04-02 DIAGNOSIS — E119 Type 2 diabetes mellitus without complications: Secondary | ICD-10-CM | POA: Diagnosis not present

## 2017-04-05 DIAGNOSIS — K219 Gastro-esophageal reflux disease without esophagitis: Secondary | ICD-10-CM | POA: Diagnosis not present

## 2017-04-05 DIAGNOSIS — E782 Mixed hyperlipidemia: Secondary | ICD-10-CM | POA: Diagnosis not present

## 2017-04-05 DIAGNOSIS — E119 Type 2 diabetes mellitus without complications: Secondary | ICD-10-CM | POA: Diagnosis not present

## 2017-04-05 DIAGNOSIS — D45 Polycythemia vera: Secondary | ICD-10-CM | POA: Diagnosis not present

## 2017-05-03 DIAGNOSIS — I872 Venous insufficiency (chronic) (peripheral): Secondary | ICD-10-CM | POA: Diagnosis not present

## 2017-05-03 DIAGNOSIS — L728 Other follicular cysts of the skin and subcutaneous tissue: Secondary | ICD-10-CM | POA: Diagnosis not present

## 2017-05-03 DIAGNOSIS — L918 Other hypertrophic disorders of the skin: Secondary | ICD-10-CM | POA: Diagnosis not present

## 2017-05-03 DIAGNOSIS — L304 Erythema intertrigo: Secondary | ICD-10-CM | POA: Diagnosis not present

## 2017-05-25 DIAGNOSIS — E291 Testicular hypofunction: Secondary | ICD-10-CM | POA: Diagnosis not present

## 2017-07-09 DIAGNOSIS — E291 Testicular hypofunction: Secondary | ICD-10-CM | POA: Diagnosis not present

## 2017-08-11 DIAGNOSIS — E291 Testicular hypofunction: Secondary | ICD-10-CM | POA: Diagnosis not present

## 2017-08-14 DIAGNOSIS — I10 Essential (primary) hypertension: Secondary | ICD-10-CM | POA: Diagnosis not present

## 2017-08-14 DIAGNOSIS — R5383 Other fatigue: Secondary | ICD-10-CM | POA: Diagnosis not present

## 2017-08-14 DIAGNOSIS — E119 Type 2 diabetes mellitus without complications: Secondary | ICD-10-CM | POA: Diagnosis not present

## 2017-08-14 DIAGNOSIS — E291 Testicular hypofunction: Secondary | ICD-10-CM | POA: Diagnosis not present

## 2017-08-16 DIAGNOSIS — E119 Type 2 diabetes mellitus without complications: Secondary | ICD-10-CM | POA: Diagnosis not present

## 2017-08-23 ENCOUNTER — Other Ambulatory Visit (HOSPITAL_BASED_OUTPATIENT_CLINIC_OR_DEPARTMENT_OTHER): Payer: Self-pay

## 2017-08-23 DIAGNOSIS — G4730 Sleep apnea, unspecified: Secondary | ICD-10-CM

## 2017-08-23 DIAGNOSIS — G4710 Hypersomnia, unspecified: Secondary | ICD-10-CM

## 2017-08-23 DIAGNOSIS — R0683 Snoring: Secondary | ICD-10-CM

## 2017-08-24 ENCOUNTER — Ambulatory Visit: Payer: BLUE CROSS/BLUE SHIELD | Attending: Internal Medicine | Admitting: Neurology

## 2017-08-24 DIAGNOSIS — G4710 Hypersomnia, unspecified: Secondary | ICD-10-CM | POA: Diagnosis not present

## 2017-08-24 DIAGNOSIS — G4730 Sleep apnea, unspecified: Secondary | ICD-10-CM | POA: Diagnosis not present

## 2017-08-24 DIAGNOSIS — G4733 Obstructive sleep apnea (adult) (pediatric): Secondary | ICD-10-CM | POA: Insufficient documentation

## 2017-08-24 DIAGNOSIS — R0683 Snoring: Secondary | ICD-10-CM | POA: Insufficient documentation

## 2017-09-08 NOTE — Procedures (Signed)
' [place]. Merlene Laughter, MD     [email or web address]             NOCTURNAL POLYSOMNOGRAPHY   LOCATION: [facility]    Patient Name: Arthur Galvan, Arthur Galvan Date: 08/24/2017 Gender: Male D.O.B: 1961-06-05 Age (years): 56 Referring Provider: Delphina Cahill  Height (inches): 69 Interpreting Physician: Phillips Odor MD, ABSM Weight (lbs): 289 RPSGT: Peak, Robert  BMI: 43 MRN: [id number] Neck Size: 20.50   CLINICAL INFORMATION  Sleep Study Type: Split Night CPAP    Indication for sleep study: Excessive Daytime Sleepiness, Snoring    Epworth Sleepiness Score: 8  SLEEP STUDY TECHNIQUE  As per the AASM Manual for the Scoring of Sleep and Associated Events v2.3 (April 2016) with a hypopnea requiring 4% desaturations.  The channels recorded and monitored were frontal, central and occipital EEG, electrooculogram (EOG), submentalis EMG (chin), nasal and oral airflow, thoracic and abdominal wall motion, anterior tibialis EMG, snore microphone, electrocardiogram, and pulse oximetry. Continuous positive airway pressure (CPAP) was initiated when the patient met split night criteria and was titrated according to treat sleep-disordered breathing. MEDICATIONS  Medications self-administered by patient taken the night of the study : N/A   Current Outpatient Medications:  .  amLODipine (NORVASC) 5 MG tablet, Take 1 tablet (5 mg total) by mouth every morning., Disp: 30 tablet, Rfl: 5 .  buPROPion (WELLBUTRIN SR) 150 MG 12 hr tablet, Take 1 tablet (150 mg total) by mouth 2 (two) times daily., Disp: 60 tablet, Rfl: 5 .  CIALIS 20 MG tablet, TAKE ONE TABLET TWO HOURS PRIOR TO SEX, Disp: 8 tablet, Rfl: 1 .  diclofenac (VOLTAREN) 75 MG EC tablet, Take 1 tablet (75 mg total) by mouth 2 (two) times daily with a meal., Disp: 28 tablet, Rfl: 5 .  DOXYCYCLINE PO, Take by mouth., Disp: , Rfl:  .  fenofibrate 160 MG tablet, TAKE ONE TABLET DAILY AT BEDTIME, Disp: 30 tablet, Rfl: 5 .   levofloxacin (LEVAQUIN) 500 MG tablet, Take 1 tablet (500 mg total) by mouth daily., Disp: 7 tablet, Rfl: 0 .  pravastatin (PRAVACHOL) 80 MG tablet, TAKE ONE TABLET DAILY AT BEDTIME, Disp: 15 tablet, Rfl: 0  RESPIRATORY PARAMETERS  Diagnostic Total AHI (/hr):  62.2 RDI (/hr): 63.7 OA Index (/hr):  34.6 CA Index (/hr):  0.0 REM AHI (/hr):  36.0 NREM AHI (/hr):  64.0 Supine AHI (/hr):  62.2 Non-supine AHI (/hr):  N/A Min O2 Sat (%): 71.00 Mean O2 (%): 84.64 Time below 88% (min): 119.8   Titration Optimal Pressure (cm):  AHI at Optimal Pressure (/hr): N/A Min O2 at Optimal Pressure (%): 75.00 Supine % at Optimal (%): N/A Sleep % at Optimal (%): N/A   SLEEP ARCHITECTURE  The recording time for the entire night was 385.2 minutes. During a baseline period of 159.8 minutes, the patient slept for 152.5 minutes in REM and nonREM, yielding a sleep efficiency of 95.4%. Sleep onset after lights out was 2.5 minutes with a REM latency of 66.0 minutes. The patient spent 11.15% of the night in stage N1 sleep, 82.30% in stage N2 sleep, 0.00% in stage N3 and 6.56% in REM.    During the titration period of 221.2 minutes, the patient slept for 211.6 minutes in REM and nonREM, yielding a sleep efficiency of 95.7%. Sleep onset after CPAP initiation was 4.6 minutes with a REM latency of 11.0 minutes. The patient spent 8.50% of the night in stage N1 sleep, 61.26% in stage N2 sleep, 0.00% in stage N3  and 30.24% in REM. CARDIAC DATA  The 2 lead EKG demonstrated sinus rhythm. The mean heart rate was 79.35 beats per minute. Other EKG findings include: None. LEG MOVEMENT DATA  The total Periodic Limb Movements of Sleep (PLMS) were 0. The PLMS index was 0.00.      IMPRESSIONS  Severe obstructive sleep apnea occurred during the diagnostic portion of the study (AHI = 62.2/hour). The desaturations were more profound during REM sleep suggesting REM related hypoventilation syndrome.  The optimal  pressure is 11 during this recording although somewhat limited as REM sleep was not obtained during this pressure.  I recommend that a nocturnal oximetry be carried a one month after the patient has been on CPAP.      Delano Metz, MD Diplomate, American Board of Sleep Medicine.       ELECTRONICALLY SIGNED ON:  09/08/2017, 4:41 PM [place] PH: [phone]   FX: [phone] [place]

## 2017-09-14 DIAGNOSIS — G4733 Obstructive sleep apnea (adult) (pediatric): Secondary | ICD-10-CM | POA: Diagnosis not present

## 2017-09-17 DIAGNOSIS — R5383 Other fatigue: Secondary | ICD-10-CM | POA: Diagnosis not present

## 2017-09-17 DIAGNOSIS — E291 Testicular hypofunction: Secondary | ICD-10-CM | POA: Diagnosis not present

## 2017-10-14 DIAGNOSIS — G4733 Obstructive sleep apnea (adult) (pediatric): Secondary | ICD-10-CM | POA: Diagnosis not present

## 2017-10-22 DIAGNOSIS — E291 Testicular hypofunction: Secondary | ICD-10-CM | POA: Diagnosis not present

## 2017-11-10 DIAGNOSIS — Z0001 Encounter for general adult medical examination with abnormal findings: Secondary | ICD-10-CM | POA: Diagnosis not present

## 2017-11-14 DIAGNOSIS — G4733 Obstructive sleep apnea (adult) (pediatric): Secondary | ICD-10-CM | POA: Diagnosis not present

## 2017-11-14 DIAGNOSIS — D72829 Elevated white blood cell count, unspecified: Secondary | ICD-10-CM | POA: Diagnosis not present

## 2017-11-14 DIAGNOSIS — D45 Polycythemia vera: Secondary | ICD-10-CM | POA: Diagnosis not present

## 2017-11-16 DIAGNOSIS — E119 Type 2 diabetes mellitus without complications: Secondary | ICD-10-CM | POA: Diagnosis not present

## 2017-11-16 DIAGNOSIS — E782 Mixed hyperlipidemia: Secondary | ICD-10-CM | POA: Diagnosis not present

## 2017-11-16 DIAGNOSIS — E291 Testicular hypofunction: Secondary | ICD-10-CM | POA: Diagnosis not present

## 2017-11-16 DIAGNOSIS — K219 Gastro-esophageal reflux disease without esophagitis: Secondary | ICD-10-CM | POA: Diagnosis not present

## 2017-11-16 DIAGNOSIS — N529 Male erectile dysfunction, unspecified: Secondary | ICD-10-CM | POA: Diagnosis not present

## 2017-11-16 DIAGNOSIS — I10 Essential (primary) hypertension: Secondary | ICD-10-CM | POA: Diagnosis not present

## 2017-11-16 DIAGNOSIS — D751 Secondary polycythemia: Secondary | ICD-10-CM | POA: Diagnosis not present

## 2017-11-18 DIAGNOSIS — J0100 Acute maxillary sinusitis, unspecified: Secondary | ICD-10-CM | POA: Diagnosis not present

## 2017-11-21 DIAGNOSIS — G4733 Obstructive sleep apnea (adult) (pediatric): Secondary | ICD-10-CM | POA: Diagnosis not present

## 2017-12-05 DIAGNOSIS — M1811 Unilateral primary osteoarthritis of first carpometacarpal joint, right hand: Secondary | ICD-10-CM | POA: Diagnosis not present

## 2017-12-05 DIAGNOSIS — M1812 Unilateral primary osteoarthritis of first carpometacarpal joint, left hand: Secondary | ICD-10-CM | POA: Diagnosis not present

## 2017-12-05 DIAGNOSIS — M170 Bilateral primary osteoarthritis of knee: Secondary | ICD-10-CM | POA: Diagnosis not present

## 2017-12-07 DIAGNOSIS — E291 Testicular hypofunction: Secondary | ICD-10-CM | POA: Diagnosis not present

## 2017-12-15 DIAGNOSIS — G4733 Obstructive sleep apnea (adult) (pediatric): Secondary | ICD-10-CM | POA: Diagnosis not present

## 2017-12-18 DIAGNOSIS — I10 Essential (primary) hypertension: Secondary | ICD-10-CM | POA: Diagnosis not present

## 2017-12-18 DIAGNOSIS — E782 Mixed hyperlipidemia: Secondary | ICD-10-CM | POA: Diagnosis not present

## 2017-12-18 DIAGNOSIS — E119 Type 2 diabetes mellitus without complications: Secondary | ICD-10-CM | POA: Diagnosis not present

## 2017-12-18 DIAGNOSIS — D72829 Elevated white blood cell count, unspecified: Secondary | ICD-10-CM | POA: Diagnosis not present

## 2017-12-18 DIAGNOSIS — N529 Male erectile dysfunction, unspecified: Secondary | ICD-10-CM | POA: Diagnosis not present

## 2017-12-20 DIAGNOSIS — M1812 Unilateral primary osteoarthritis of first carpometacarpal joint, left hand: Secondary | ICD-10-CM | POA: Diagnosis not present

## 2017-12-20 DIAGNOSIS — M1811 Unilateral primary osteoarthritis of first carpometacarpal joint, right hand: Secondary | ICD-10-CM | POA: Diagnosis not present

## 2017-12-20 DIAGNOSIS — G4733 Obstructive sleep apnea (adult) (pediatric): Secondary | ICD-10-CM | POA: Diagnosis not present

## 2017-12-22 DIAGNOSIS — D72829 Elevated white blood cell count, unspecified: Secondary | ICD-10-CM | POA: Diagnosis not present

## 2017-12-22 DIAGNOSIS — E119 Type 2 diabetes mellitus without complications: Secondary | ICD-10-CM | POA: Diagnosis not present

## 2017-12-22 DIAGNOSIS — R718 Other abnormality of red blood cells: Secondary | ICD-10-CM | POA: Diagnosis not present

## 2017-12-22 DIAGNOSIS — E782 Mixed hyperlipidemia: Secondary | ICD-10-CM | POA: Diagnosis not present

## 2017-12-28 DIAGNOSIS — E291 Testicular hypofunction: Secondary | ICD-10-CM | POA: Diagnosis not present

## 2018-01-12 DIAGNOSIS — G4733 Obstructive sleep apnea (adult) (pediatric): Secondary | ICD-10-CM | POA: Diagnosis not present

## 2018-01-20 DIAGNOSIS — G4733 Obstructive sleep apnea (adult) (pediatric): Secondary | ICD-10-CM | POA: Diagnosis not present

## 2018-01-26 DIAGNOSIS — E291 Testicular hypofunction: Secondary | ICD-10-CM | POA: Diagnosis not present

## 2018-02-12 DIAGNOSIS — G4733 Obstructive sleep apnea (adult) (pediatric): Secondary | ICD-10-CM | POA: Diagnosis not present

## 2018-02-16 DIAGNOSIS — E291 Testicular hypofunction: Secondary | ICD-10-CM | POA: Diagnosis not present

## 2018-02-19 DIAGNOSIS — G4733 Obstructive sleep apnea (adult) (pediatric): Secondary | ICD-10-CM | POA: Diagnosis not present

## 2018-03-09 DIAGNOSIS — E291 Testicular hypofunction: Secondary | ICD-10-CM | POA: Diagnosis not present

## 2018-03-14 DIAGNOSIS — G4733 Obstructive sleep apnea (adult) (pediatric): Secondary | ICD-10-CM | POA: Diagnosis not present

## 2018-03-21 DIAGNOSIS — E119 Type 2 diabetes mellitus without complications: Secondary | ICD-10-CM | POA: Diagnosis not present

## 2018-03-21 DIAGNOSIS — E782 Mixed hyperlipidemia: Secondary | ICD-10-CM | POA: Diagnosis not present

## 2018-03-22 DIAGNOSIS — G4733 Obstructive sleep apnea (adult) (pediatric): Secondary | ICD-10-CM | POA: Diagnosis not present

## 2018-03-23 DIAGNOSIS — E119 Type 2 diabetes mellitus without complications: Secondary | ICD-10-CM | POA: Diagnosis not present

## 2018-03-23 DIAGNOSIS — K219 Gastro-esophageal reflux disease without esophagitis: Secondary | ICD-10-CM | POA: Diagnosis not present

## 2018-03-23 DIAGNOSIS — D751 Secondary polycythemia: Secondary | ICD-10-CM | POA: Diagnosis not present

## 2018-03-23 DIAGNOSIS — E291 Testicular hypofunction: Secondary | ICD-10-CM | POA: Diagnosis not present

## 2018-03-30 DIAGNOSIS — E291 Testicular hypofunction: Secondary | ICD-10-CM | POA: Diagnosis not present

## 2018-04-14 DIAGNOSIS — G4733 Obstructive sleep apnea (adult) (pediatric): Secondary | ICD-10-CM | POA: Diagnosis not present

## 2018-04-20 DIAGNOSIS — E291 Testicular hypofunction: Secondary | ICD-10-CM | POA: Diagnosis not present

## 2018-04-21 DIAGNOSIS — G4733 Obstructive sleep apnea (adult) (pediatric): Secondary | ICD-10-CM | POA: Diagnosis not present

## 2018-05-11 DIAGNOSIS — E291 Testicular hypofunction: Secondary | ICD-10-CM | POA: Diagnosis not present

## 2018-05-14 DIAGNOSIS — G4733 Obstructive sleep apnea (adult) (pediatric): Secondary | ICD-10-CM | POA: Diagnosis not present

## 2018-05-22 DIAGNOSIS — G4733 Obstructive sleep apnea (adult) (pediatric): Secondary | ICD-10-CM | POA: Diagnosis not present

## 2018-06-14 DIAGNOSIS — G4733 Obstructive sleep apnea (adult) (pediatric): Secondary | ICD-10-CM | POA: Diagnosis not present

## 2018-06-14 DIAGNOSIS — E291 Testicular hypofunction: Secondary | ICD-10-CM | POA: Diagnosis not present

## 2018-06-22 DIAGNOSIS — G4733 Obstructive sleep apnea (adult) (pediatric): Secondary | ICD-10-CM | POA: Diagnosis not present

## 2018-06-27 DIAGNOSIS — I10 Essential (primary) hypertension: Secondary | ICD-10-CM | POA: Diagnosis not present

## 2018-06-27 DIAGNOSIS — E119 Type 2 diabetes mellitus without complications: Secondary | ICD-10-CM | POA: Diagnosis not present

## 2018-06-27 DIAGNOSIS — R718 Other abnormality of red blood cells: Secondary | ICD-10-CM | POA: Diagnosis not present

## 2018-06-27 DIAGNOSIS — R7301 Impaired fasting glucose: Secondary | ICD-10-CM | POA: Diagnosis not present

## 2018-06-27 DIAGNOSIS — D72829 Elevated white blood cell count, unspecified: Secondary | ICD-10-CM | POA: Diagnosis not present

## 2018-06-27 DIAGNOSIS — E782 Mixed hyperlipidemia: Secondary | ICD-10-CM | POA: Diagnosis not present

## 2018-06-29 DIAGNOSIS — G4733 Obstructive sleep apnea (adult) (pediatric): Secondary | ICD-10-CM | POA: Diagnosis not present

## 2018-06-29 DIAGNOSIS — Z720 Tobacco use: Secondary | ICD-10-CM | POA: Diagnosis not present

## 2018-06-29 DIAGNOSIS — D72829 Elevated white blood cell count, unspecified: Secondary | ICD-10-CM | POA: Diagnosis not present

## 2018-06-29 DIAGNOSIS — E782 Mixed hyperlipidemia: Secondary | ICD-10-CM | POA: Diagnosis not present

## 2018-06-29 DIAGNOSIS — E119 Type 2 diabetes mellitus without complications: Secondary | ICD-10-CM | POA: Diagnosis not present

## 2018-07-06 DIAGNOSIS — E291 Testicular hypofunction: Secondary | ICD-10-CM | POA: Diagnosis not present

## 2018-07-15 DIAGNOSIS — G4733 Obstructive sleep apnea (adult) (pediatric): Secondary | ICD-10-CM | POA: Diagnosis not present

## 2018-07-22 DIAGNOSIS — G4733 Obstructive sleep apnea (adult) (pediatric): Secondary | ICD-10-CM | POA: Diagnosis not present

## 2018-07-27 DIAGNOSIS — E291 Testicular hypofunction: Secondary | ICD-10-CM | POA: Diagnosis not present

## 2018-08-14 DIAGNOSIS — G4733 Obstructive sleep apnea (adult) (pediatric): Secondary | ICD-10-CM | POA: Diagnosis not present

## 2018-08-22 DIAGNOSIS — G4733 Obstructive sleep apnea (adult) (pediatric): Secondary | ICD-10-CM | POA: Diagnosis not present

## 2018-08-24 DIAGNOSIS — E291 Testicular hypofunction: Secondary | ICD-10-CM | POA: Diagnosis not present

## 2018-09-13 DIAGNOSIS — R0981 Nasal congestion: Secondary | ICD-10-CM | POA: Diagnosis not present

## 2018-09-13 DIAGNOSIS — E291 Testicular hypofunction: Secondary | ICD-10-CM | POA: Diagnosis not present

## 2018-09-13 DIAGNOSIS — J060 Acute laryngopharyngitis: Secondary | ICD-10-CM | POA: Diagnosis not present

## 2018-09-14 DIAGNOSIS — G4733 Obstructive sleep apnea (adult) (pediatric): Secondary | ICD-10-CM | POA: Diagnosis not present

## 2018-09-17 DIAGNOSIS — I10 Essential (primary) hypertension: Secondary | ICD-10-CM | POA: Diagnosis not present

## 2018-09-17 DIAGNOSIS — H6501 Acute serous otitis media, right ear: Secondary | ICD-10-CM | POA: Diagnosis not present

## 2018-09-17 DIAGNOSIS — F17218 Nicotine dependence, cigarettes, with other nicotine-induced disorders: Secondary | ICD-10-CM | POA: Diagnosis not present

## 2018-09-17 DIAGNOSIS — J069 Acute upper respiratory infection, unspecified: Secondary | ICD-10-CM | POA: Diagnosis not present

## 2018-09-17 DIAGNOSIS — K219 Gastro-esophageal reflux disease without esophagitis: Secondary | ICD-10-CM | POA: Diagnosis not present

## 2018-09-21 DIAGNOSIS — G4733 Obstructive sleep apnea (adult) (pediatric): Secondary | ICD-10-CM | POA: Diagnosis not present

## 2018-09-26 DIAGNOSIS — I10 Essential (primary) hypertension: Secondary | ICD-10-CM | POA: Diagnosis not present

## 2018-09-26 DIAGNOSIS — R7301 Impaired fasting glucose: Secondary | ICD-10-CM | POA: Diagnosis not present

## 2018-09-28 DIAGNOSIS — K219 Gastro-esophageal reflux disease without esophagitis: Secondary | ICD-10-CM | POA: Diagnosis not present

## 2018-09-28 DIAGNOSIS — E119 Type 2 diabetes mellitus without complications: Secondary | ICD-10-CM | POA: Diagnosis not present

## 2018-09-28 DIAGNOSIS — E782 Mixed hyperlipidemia: Secondary | ICD-10-CM | POA: Diagnosis not present

## 2018-09-28 DIAGNOSIS — J309 Allergic rhinitis, unspecified: Secondary | ICD-10-CM | POA: Diagnosis not present

## 2018-10-07 DIAGNOSIS — I10 Essential (primary) hypertension: Secondary | ICD-10-CM | POA: Diagnosis not present

## 2018-10-07 DIAGNOSIS — J393 Upper respiratory tract hypersensitivity reaction, site unspecified: Secondary | ICD-10-CM | POA: Diagnosis not present

## 2018-10-07 DIAGNOSIS — J069 Acute upper respiratory infection, unspecified: Secondary | ICD-10-CM | POA: Diagnosis not present

## 2018-10-12 DIAGNOSIS — D72829 Elevated white blood cell count, unspecified: Secondary | ICD-10-CM | POA: Diagnosis not present

## 2018-10-12 DIAGNOSIS — E291 Testicular hypofunction: Secondary | ICD-10-CM | POA: Diagnosis not present

## 2018-10-22 DIAGNOSIS — G4733 Obstructive sleep apnea (adult) (pediatric): Secondary | ICD-10-CM | POA: Diagnosis not present

## 2018-11-02 DIAGNOSIS — E291 Testicular hypofunction: Secondary | ICD-10-CM | POA: Diagnosis not present

## 2018-11-22 DIAGNOSIS — G4733 Obstructive sleep apnea (adult) (pediatric): Secondary | ICD-10-CM | POA: Diagnosis not present

## 2018-11-23 DIAGNOSIS — E291 Testicular hypofunction: Secondary | ICD-10-CM | POA: Diagnosis not present

## 2018-12-13 DIAGNOSIS — E291 Testicular hypofunction: Secondary | ICD-10-CM | POA: Diagnosis not present

## 2018-12-21 DIAGNOSIS — G4733 Obstructive sleep apnea (adult) (pediatric): Secondary | ICD-10-CM | POA: Diagnosis not present

## 2019-01-08 DIAGNOSIS — E291 Testicular hypofunction: Secondary | ICD-10-CM | POA: Diagnosis not present

## 2019-01-08 DIAGNOSIS — E119 Type 2 diabetes mellitus without complications: Secondary | ICD-10-CM | POA: Diagnosis not present

## 2019-01-08 DIAGNOSIS — I10 Essential (primary) hypertension: Secondary | ICD-10-CM | POA: Diagnosis not present

## 2019-01-11 DIAGNOSIS — E291 Testicular hypofunction: Secondary | ICD-10-CM | POA: Diagnosis not present

## 2019-01-11 DIAGNOSIS — D72829 Elevated white blood cell count, unspecified: Secondary | ICD-10-CM | POA: Diagnosis not present

## 2019-01-11 DIAGNOSIS — E118 Type 2 diabetes mellitus with unspecified complications: Secondary | ICD-10-CM | POA: Diagnosis not present

## 2019-01-11 DIAGNOSIS — E782 Mixed hyperlipidemia: Secondary | ICD-10-CM | POA: Diagnosis not present

## 2019-01-19 DIAGNOSIS — G4733 Obstructive sleep apnea (adult) (pediatric): Secondary | ICD-10-CM | POA: Diagnosis not present

## 2019-02-08 DIAGNOSIS — E291 Testicular hypofunction: Secondary | ICD-10-CM | POA: Diagnosis not present

## 2019-02-19 DIAGNOSIS — G4733 Obstructive sleep apnea (adult) (pediatric): Secondary | ICD-10-CM | POA: Diagnosis not present

## 2019-02-24 DIAGNOSIS — J309 Allergic rhinitis, unspecified: Secondary | ICD-10-CM | POA: Diagnosis not present

## 2019-03-01 DIAGNOSIS — E291 Testicular hypofunction: Secondary | ICD-10-CM | POA: Diagnosis not present

## 2019-03-21 DIAGNOSIS — G4733 Obstructive sleep apnea (adult) (pediatric): Secondary | ICD-10-CM | POA: Diagnosis not present

## 2019-03-28 DIAGNOSIS — E291 Testicular hypofunction: Secondary | ICD-10-CM | POA: Diagnosis not present

## 2019-04-09 DIAGNOSIS — E291 Testicular hypofunction: Secondary | ICD-10-CM | POA: Diagnosis not present

## 2019-04-09 DIAGNOSIS — Z0000 Encounter for general adult medical examination without abnormal findings: Secondary | ICD-10-CM | POA: Diagnosis not present

## 2019-04-09 DIAGNOSIS — I10 Essential (primary) hypertension: Secondary | ICD-10-CM | POA: Diagnosis not present

## 2019-04-21 DIAGNOSIS — G4733 Obstructive sleep apnea (adult) (pediatric): Secondary | ICD-10-CM | POA: Diagnosis not present

## 2019-05-02 DIAGNOSIS — E291 Testicular hypofunction: Secondary | ICD-10-CM | POA: Diagnosis not present

## 2019-05-21 DIAGNOSIS — G4733 Obstructive sleep apnea (adult) (pediatric): Secondary | ICD-10-CM | POA: Diagnosis not present

## 2019-05-31 DIAGNOSIS — J302 Other seasonal allergic rhinitis: Secondary | ICD-10-CM | POA: Diagnosis not present

## 2019-05-31 DIAGNOSIS — E291 Testicular hypofunction: Secondary | ICD-10-CM | POA: Diagnosis not present

## 2019-06-20 DIAGNOSIS — E291 Testicular hypofunction: Secondary | ICD-10-CM | POA: Diagnosis not present

## 2019-06-21 DIAGNOSIS — G4733 Obstructive sleep apnea (adult) (pediatric): Secondary | ICD-10-CM | POA: Diagnosis not present

## 2019-07-12 DIAGNOSIS — Z23 Encounter for immunization: Secondary | ICD-10-CM | POA: Diagnosis not present

## 2019-07-12 DIAGNOSIS — E291 Testicular hypofunction: Secondary | ICD-10-CM | POA: Diagnosis not present

## 2019-07-16 DIAGNOSIS — G4733 Obstructive sleep apnea (adult) (pediatric): Secondary | ICD-10-CM | POA: Diagnosis not present

## 2019-07-22 DIAGNOSIS — G4733 Obstructive sleep apnea (adult) (pediatric): Secondary | ICD-10-CM | POA: Diagnosis not present

## 2019-08-02 DIAGNOSIS — E291 Testicular hypofunction: Secondary | ICD-10-CM | POA: Diagnosis not present

## 2019-08-07 ENCOUNTER — Encounter (HOSPITAL_COMMUNITY): Payer: Self-pay | Admitting: *Deleted

## 2019-08-07 DIAGNOSIS — Z87891 Personal history of nicotine dependence: Secondary | ICD-10-CM

## 2019-08-07 DIAGNOSIS — Z122 Encounter for screening for malignant neoplasm of respiratory organs: Secondary | ICD-10-CM

## 2019-08-07 NOTE — Progress Notes (Signed)
Received self-referral for initial lung cancer screening scan.  Contacted patient and obtained smoking history (started age 58, current smoker, 80 pack year) as well as answering questions related to the screening process.  Patient denies signs/symptoms of lung cancer such as weight loss or hemoptysis.  Patient denies comorbidity that would prevent curative treatment if lung cancer were to be found.  Patient is scheduled for shared decision making visit and CT scan on 10/27 at 0910.

## 2019-08-18 ENCOUNTER — Other Ambulatory Visit: Payer: Self-pay

## 2019-08-19 ENCOUNTER — Encounter (HOSPITAL_COMMUNITY): Payer: Self-pay | Admitting: *Deleted

## 2019-08-19 ENCOUNTER — Inpatient Hospital Stay (HOSPITAL_COMMUNITY): Payer: BC Managed Care – PPO | Attending: Hematology | Admitting: Hematology

## 2019-08-19 ENCOUNTER — Ambulatory Visit (HOSPITAL_COMMUNITY)
Admission: RE | Admit: 2019-08-19 | Discharge: 2019-08-19 | Disposition: A | Discharge status: Home / Self Care | Payer: BC Managed Care – PPO | Source: Ambulatory Visit | Attending: Hematology | Admitting: Hematology

## 2019-08-19 DIAGNOSIS — Z87891 Personal history of nicotine dependence: Secondary | ICD-10-CM | POA: Insufficient documentation

## 2019-08-19 DIAGNOSIS — F17210 Nicotine dependence, cigarettes, uncomplicated: Secondary | ICD-10-CM | POA: Diagnosis not present

## 2019-08-19 DIAGNOSIS — Z801 Family history of malignant neoplasm of trachea, bronchus and lung: Secondary | ICD-10-CM | POA: Diagnosis not present

## 2019-08-19 DIAGNOSIS — Z79899 Other long term (current) drug therapy: Secondary | ICD-10-CM | POA: Diagnosis not present

## 2019-08-19 DIAGNOSIS — R0602 Shortness of breath: Secondary | ICD-10-CM | POA: Diagnosis not present

## 2019-08-19 DIAGNOSIS — I10 Essential (primary) hypertension: Secondary | ICD-10-CM | POA: Diagnosis not present

## 2019-08-19 DIAGNOSIS — Z720 Tobacco use: Secondary | ICD-10-CM

## 2019-08-19 DIAGNOSIS — Z791 Long term (current) use of non-steroidal anti-inflammatories (NSAID): Secondary | ICD-10-CM | POA: Diagnosis not present

## 2019-08-19 DIAGNOSIS — Z122 Encounter for screening for malignant neoplasm of respiratory organs: Secondary | ICD-10-CM | POA: Insufficient documentation

## 2019-08-19 DIAGNOSIS — E7800 Pure hypercholesterolemia, unspecified: Secondary | ICD-10-CM | POA: Insufficient documentation

## 2019-08-19 DIAGNOSIS — R05 Cough: Secondary | ICD-10-CM | POA: Insufficient documentation

## 2019-08-19 NOTE — Assessment & Plan Note (Signed)
1. Tobacco abuse -This patient meets the criteriafor low dose CT lung cancer screening. She isasymptomatic for any signs or symptoms of lung cancer. -The Shared Decision-Making Visit discussion included risks and benefits of screening, potential for follow up,diagnostic testing for abnormal scans, potential for false positive tests, over diagnosis, and discussion about total radiation exposure. -Patient stated willingness to undergo diagnostics and treatment as needed. - He wascounseled on smoking cessation to decreaserisk of lung cancer, pulmonary disease, heart disease and stroke. - He was given a resource card with information on receiving free nicotine replacement therapy , and information about free smoking cessation classes. - He will present for her LDCT scan today and follow up with PCP. 

## 2019-08-19 NOTE — Progress Notes (Signed)
Patient notified via mail of LDCT l ung cancer screening results with recommendations to follow up in 12 months.  Also notified of incidental findings and need to follow up with PCP.  Patient's referring provider was sent a copy of results.    IMPRESSION: 1. Lung-RADS 1S, negative. Continue annual screening with low-dose chest CT without contrast in 12 months. 2. The "S" modifier above refers to potentially clinically significant non lung cancer related findings. Specifically, there is aortic atherosclerosis, in addition to 2 vessel coronary artery disease. Please note that although the presence of coronary artery calcium documents the presence of coronary artery disease, the severity of this disease and any potential stenosis cannot be assessed on this non-gated CT examination. Assessment for potential risk factor modification, dietary therapy or pharmacologic therapy may be warranted, if clinically indicated. 3. Mild diffuse bronchial wall thickening with mild centrilobular and paraseptal emphysema; imaging findings suggestive of underlying COPD. 4. Hepatic steatosis.  Aortic Atherosclerosis (ICD10-I70.0) and Emphysema (ICD10-J43.9).

## 2019-08-19 NOTE — Progress Notes (Signed)
[facility] Initial Visit:  Patient Care Team: Celene Squibb, MD as PCP - General (Internal Medicine)  CHIEF COMPLAINTS/PURPOSE OF CONSULTATION: - Shared Decision-Making Visit for Lung Cancer Screening  HISTORY OF PRESENTING ILLNESS: Arthur Galvan 58 y.o. male Arthur Galvan today for shared decision making visit for a lung cancer screening.  Patient states she started smoking at age 16.  At one point patient smoked 3 packs of cigarettes a day, this lasted for about 2 years.  Patient has consistently smoked 2 packs of cigarettes a day since the age of 63.  He is currently on Wellbutrin he states this does help him and his urge to smoke.  He does admit to chronic cough with no sputum production.  Denies hemoptysis.  Does report shortness of breath on exertion that resolves with rest.  He denies any chest pain, lightheadedness or dizziness.  No change in bowel habits.  Appetite is stable.  No weight loss.  Family history is positive for lung cancer in his father and parental uncle with lung cancer.  He does have a primary care provider that he follows up with regularly.  Review of Systems  Constitutional: Positive for fatigue.  HENT:  Negative.   Eyes: Negative.   Respiratory: Positive for cough.   Cardiovascular: Negative.   Gastrointestinal: Negative.   Genitourinary: Negative.    Musculoskeletal: Positive for arthralgias, back pain and myalgias.  Skin: Negative.   Neurological: Negative.   Hematological: Negative.   Psychiatric/Behavioral: Negative.     MEDICAL HISTORY: Past Medical History:  Diagnosis Date  . GERD (gastroesophageal reflux disease)   . Hypercholesteremia   . Hypertension     SURGICAL HISTORY: Past Surgical History:  Procedure Laterality Date  . abscess removed from groin    . COLONOSCOPY N/A 08/13/2014   Procedure: COLONOSCOPY;  Surgeon: Rogene [facility], MD;  Location: [facility];  Service: Endoscopy;  Laterality: N/A;  930-moved to 915 Ann to  notify pt    SOCIAL HISTORY: Social History   Socioeconomic History  . Marital status: Divorced    Spouse name: Not on file  . Number of children: Not on file  . Years of education: Not on file  . Highest education level: Not on file  Occupational History  . Not on file  Social Needs  . Financial resource strain: Not on file  . Food insecurity    Worry: Not on file    Inability: Not on file  . Transportation needs    Medical: Not on file    Non-medical: Not on file  Tobacco Use  . Smoking status: Current Every Day Smoker    Packs/day: 1.00    Years: 20.00    Pack years: 20.00    Types: Cigarettes  Substance and Sexual Activity  . Alcohol use: No  . Drug use: No  . Sexual activity: Not on file  Lifestyle  . Physical activity    Days per week: Not on file    Minutes per session: Not on file  . Stress: Not on file  Relationships  . Social Herbalist on phone: Not on file    Gets together: Not on file    Attends religious service: Not on file    Active member of club or organization: Not on file    Attends meetings of clubs or organizations: Not on file    Relationship status: Not on file  . Intimate partner violence    Fear  of current or ex partner: Not on file    Emotionally abused: Not on file    Physically abused: Not on file    Forced sexual activity: Not on file  Other Topics Concern  . Not on file  Social History Narrative  . Not on file    FAMILY HISTORY Family History  Problem Relation Age of Onset  . Colon cancer Neg Hx     ALLERGIES:  has No Known Allergies.  MEDICATIONS:  Current Outpatient Medications  Medication Sig Dispense Refill  . amLODipine (NORVASC) 5 MG tablet Take 1 tablet (5 mg total) by mouth every morning. 30 tablet 5  . buPROPion (WELLBUTRIN SR) 150 MG 12 hr tablet Take 1 tablet (150 mg total) by mouth 2 (two) times daily. 60 tablet 5  . CIALIS 20 MG tablet TAKE ONE TABLET TWO HOURS PRIOR TO SEX 8 tablet 1  .  diclofenac (VOLTAREN) 75 MG EC tablet Take 1 tablet (75 mg total) by mouth 2 (two) times daily with a meal. 28 tablet 5  . DOXYCYCLINE PO Take by mouth.    . fenofibrate 160 MG tablet TAKE ONE TABLET DAILY AT BEDTIME 30 tablet 5  . levofloxacin (LEVAQUIN) 500 MG tablet Take 1 tablet (500 mg total) by mouth daily. 7 tablet 0  . pravastatin (PRAVACHOL) 80 MG tablet TAKE ONE TABLET DAILY AT BEDTIME 15 tablet 0   No current facility-administered medications for this visit.     PHYSICAL EXAMINATION:  ECOG PERFORMANCE STATUS: 1 - Symptomatic but completely ambulatory   There were no vitals filed for this visit.  There were no vitals filed for this visit.   Physical Exam Constitutional:      Appearance: Normal appearance. He is obese.  HENT:     Head: Normocephalic.     Right Ear: External ear normal.     Left Ear: External ear normal.     Nose: Nose normal.     Mouth/Throat:     Mouth: Mucous membranes are moist.     Pharynx: Oropharynx is clear.  Eyes:     Conjunctiva/sclera: Conjunctivae normal.  Neck:     Musculoskeletal: Normal range of motion.  Cardiovascular:     Rate and Rhythm: Normal rate and regular rhythm.     Pulses: Normal pulses.     Heart sounds: Normal heart sounds.  Pulmonary:     Effort: Pulmonary effort is normal.     Comments: Diminished breath sounds all 4 lobes Abdominal:     General: Bowel sounds are normal.  Musculoskeletal: Normal range of motion.  Skin:    General: Skin is warm.  Neurological:     General: No focal deficit present.     Mental Status: He is alert and oriented to person, place, and time. Mental status is at baseline.  Psychiatric:        Mood and Affect: Mood normal.        Thought Content: Thought content normal.        Judgment: Judgment normal.      LABORATORY DATA: I have personally reviewed the data as listed:  No visits with results within 1 Month(s) from this visit.  Latest known visit with results is:  Office  Visit on 01/21/2015  Component Date Value Ref Range Status  . Hemoglobin A1C 01/21/2015 6.2   Final  . Glucose Fasting, POC 01/21/2015 124* 70 - 99 mg/dL Final    RADIOGRAPHIC STUDIES: I have personally reviewed the radiological images as listed  and agree with the findings in the report  No results found.  ASSESSMENT/PLAN   Tobacco abuse 1. Tobacco abuse -This patient meets the criteriafor low dose CT lung cancer screening. She isasymptomatic for any signs or symptoms of lung cancer. -The Shared Decision-Making Visit discussion included risks and benefits of screening, potential for follow up,diagnostic testing for abnormal scans, potential for false positive tests, over diagnosis, and discussion about total radiation exposure. -Patient stated willingness to undergo diagnostics and treatment as needed. - He wascounseled on smoking cessation to decreaserisk of lung cancer, pulmonary disease, heart disease and stroke. - He was given a resource card with information on receiving free nicotine replacement therapy , and information about free smoking cessation classes. - He will present for her LDCT scan today and follow up with PCP.     All questions were answered. The patient knows to call the clinic with any problems, questions or concerns.  This note was electronically signed.    Roger Shelter, FNP  08/19/2019 11:52 AM

## 2019-08-19 NOTE — Patient Instructions (Signed)
You were seen today for your shared decision making visit and a low-dose CT scan for lung cancer screening.    

## 2019-08-21 DIAGNOSIS — G4733 Obstructive sleep apnea (adult) (pediatric): Secondary | ICD-10-CM | POA: Diagnosis not present

## 2019-08-29 DIAGNOSIS — I10 Essential (primary) hypertension: Secondary | ICD-10-CM | POA: Diagnosis not present

## 2019-08-29 DIAGNOSIS — E118 Type 2 diabetes mellitus with unspecified complications: Secondary | ICD-10-CM | POA: Diagnosis not present

## 2019-09-05 DIAGNOSIS — E1169 Type 2 diabetes mellitus with other specified complication: Secondary | ICD-10-CM | POA: Diagnosis not present

## 2019-09-05 DIAGNOSIS — I10 Essential (primary) hypertension: Secondary | ICD-10-CM | POA: Diagnosis not present

## 2019-09-05 DIAGNOSIS — J019 Acute sinusitis, unspecified: Secondary | ICD-10-CM | POA: Diagnosis not present

## 2019-09-05 DIAGNOSIS — E291 Testicular hypofunction: Secondary | ICD-10-CM | POA: Diagnosis not present

## 2019-09-21 DIAGNOSIS — G4733 Obstructive sleep apnea (adult) (pediatric): Secondary | ICD-10-CM | POA: Diagnosis not present

## 2019-09-24 ENCOUNTER — Encounter: Payer: Self-pay | Admitting: *Deleted

## 2019-09-25 ENCOUNTER — Ambulatory Visit (INDEPENDENT_AMBULATORY_CARE_PROVIDER_SITE_OTHER): Payer: BC Managed Care – PPO | Admitting: Cardiovascular Disease

## 2019-09-25 ENCOUNTER — Other Ambulatory Visit: Payer: Self-pay

## 2019-09-25 ENCOUNTER — Telehealth: Payer: Self-pay | Admitting: Cardiovascular Disease

## 2019-09-25 ENCOUNTER — Encounter: Payer: Self-pay | Admitting: Cardiovascular Disease

## 2019-09-25 ENCOUNTER — Encounter: Payer: Self-pay | Admitting: *Deleted

## 2019-09-25 VITALS — BP 152/88 | HR 88 | Ht 69.0 in | Wt 275.0 lb

## 2019-09-25 DIAGNOSIS — I2510 Atherosclerotic heart disease of native coronary artery without angina pectoris: Secondary | ICD-10-CM | POA: Diagnosis not present

## 2019-09-25 DIAGNOSIS — Z720 Tobacco use: Secondary | ICD-10-CM | POA: Diagnosis not present

## 2019-09-25 DIAGNOSIS — I10 Essential (primary) hypertension: Secondary | ICD-10-CM

## 2019-09-25 DIAGNOSIS — R0789 Other chest pain: Secondary | ICD-10-CM

## 2019-09-25 DIAGNOSIS — I2584 Coronary atherosclerosis due to calcified coronary lesion: Secondary | ICD-10-CM

## 2019-09-25 NOTE — Progress Notes (Signed)
CARDIOLOGY CONSULT NOTE  Patient ID: Arthur Galvan MRN: [id number] DOB/AGE: 1961/01/07 58 y.o.  Admit date: (Not on file) Primary Physician: Celene Squibb, MD  Reason for Consultation: Abnormal chest CT  HPI: Arthur Galvan is a 59 y.o. male who is being seen today for the evaluation of abnormal chest CT at the request of Celene Squibb, MD.   I reviewed the chest CT performed for lung cancer screening on 08/19/2019 which demonstrated "aortic atherosclerosis, as well as atherosclerosis of the great vessels of the mediastinum and the coronary arteries, including calcified atherosclerotic plaque in the left anterior descending and left circumflex coronary arteries".  ECG performed in the office today which I ordered and personally interpreted demonstrates normal sinus rhythm with no ischemic ST segment or T-wave abnormalities, nor any arrhythmias.  He denies exertional chest pain.  Chronic exertional dyspnea from COPD is stable.  He had been smoking more than 2 packs of cigarettes daily for several years but has cut down to 1 pack/day and wants to quit.  He has occasional chest tightness at rest in the spring and in the fall when his COPD flares up.  He denies leg swelling, orthopnea, palpitations and paroxysmal nocturnal dyspnea.  He is here with his wife and his eldest daughter participated in the discussion by telephone.  Her name is Venita Lick and is a Marine scientist and works in the [place] center at [employer].  Family history: Mother had coronary artery disease and stents.  She was a smoker.  He has several paternal uncles who had heart disease.  Social history: His eldest daughter, Venita Lick, is a Marine scientist and works in the [place] center at [employer].   No Known Allergies  Current Outpatient Medications  Medication Sig Dispense Refill  . amLODipine (NORVASC) 5 MG tablet Take 5 mg by mouth daily.    Marland Kitchen aspirin 325 MG tablet Take 325 mg by mouth daily.     Marland Kitchen buPROPion (WELLBUTRIN XL) 300 MG 24 hr tablet Take 300 mg by mouth daily.    Marland Kitchen CIALIS 20 MG tablet TAKE ONE TABLET TWO HOURS PRIOR TO SEX 8 tablet 1  . fluticasone (FLONASE) 50 MCG/ACT nasal spray Place into both nostrils daily.    . Montelukast Sodium (SINGULAIR PO) Take by mouth.    . olmesartan-hydrochlorothiazide (BENICAR HCT) 40-12.5 MG tablet Take 1 tablet by mouth daily.    . pantoprazole (PROTONIX) 40 MG tablet Take 40 mg by mouth daily.    . sitaGLIPtin-metformin (JANUMET) 50-500 MG tablet Take 1 tablet by mouth 2 (two) times daily with a meal.    . testosterone cypionate (DEPOTESTOTERONE CYPIONATE) 100 MG/ML injection Inject 200 mg into the muscle every 14 (fourteen) days. For IM use only     No current facility-administered medications for this visit.     Past Medical History:  Diagnosis Date  . GERD (gastroesophageal reflux disease)   . Hypercholesteremia   . Hypertension     Past Surgical History:  Procedure Laterality Date  . abscess removed from groin    . COLONOSCOPY N/A 08/13/2014   Procedure: COLONOSCOPY;  Surgeon: Rogene Houston, MD;  [facility];  Service: Endoscopy;  Laterality: N/A;  930-moved to 915 Ann to notify pt    Social History   Socioeconomic History  . Marital status: Divorced    Spouse name: Not on file  . Number of children: Not on file  . Years of  education: Not on file  . Highest education level: Not on file  Occupational History  . Not on file  Social Needs  . Financial resource strain: Not on file  . Food insecurity    Worry: Not on file    Inability: Not on file  . Transportation needs    Medical: Not on file    Non-medical: Not on file  Tobacco Use  . Smoking status: Current Every Day Smoker    Packs/day: 1.00    Years: 20.00    Pack years: 20.00    Types: Cigarettes  . Smokeless tobacco: Never Used  Substance and Sexual Activity  . Alcohol use: No  . Drug use: No  . Sexual activity: Not on file  Lifestyle   . Physical activity    Days per week: Not on file    Minutes per session: Not on file  . Stress: Not on file  Relationships  . Social Herbalist on phone: Not on file    Gets together: Not on file    Attends religious service: Not on file    Active member of club or organization: Not on file    Attends meetings of clubs or organizations: Not on file    Relationship status: Not on file  . Intimate partner violence    Fear of current or ex partner: Not on file    Emotionally abused: Not on file    Physically abused: Not on file    Forced sexual activity: Not on file  Other Topics Concern  . Not on file  Social History Narrative  . Not on file      Current Meds  Medication Sig  . amLODipine (NORVASC) 5 MG tablet Take 5 mg by mouth daily.  Marland Kitchen aspirin 325 MG tablet Take 325 mg by mouth daily.  Marland Kitchen buPROPion (WELLBUTRIN XL) 300 MG 24 hr tablet Take 300 mg by mouth daily.  Marland Kitchen CIALIS 20 MG tablet TAKE ONE TABLET TWO HOURS PRIOR TO SEX  . fluticasone (FLONASE) 50 MCG/ACT nasal spray Place into both nostrils daily.  . Montelukast Sodium (SINGULAIR PO) Take by mouth.  . olmesartan-hydrochlorothiazide (BENICAR HCT) 40-12.5 MG tablet Take 1 tablet by mouth daily.  . pantoprazole (PROTONIX) 40 MG tablet Take 40 mg by mouth daily.  . sitaGLIPtin-metformin (JANUMET) 50-500 MG tablet Take 1 tablet by mouth 2 (two) times daily with a meal.  . testosterone cypionate (DEPOTESTOTERONE CYPIONATE) 100 MG/ML injection Inject 200 mg into the muscle every 14 (fourteen) days. For IM use only      Review of systems complete and found to be negative unless listed above in HPI    Physical exam Blood pressure (!) 152/88, pulse 88, height 5\' 9"  (1.753 m), weight 275 lb (124.7 kg), SpO2 98 %. General: NAD Neck: No JVD, no thyromegaly or thyroid nodule.  Lungs: Poor air movement throughout, no crackles or wheezes. CV: Nondisplaced PMI. Regular rate and rhythm, normal S1/S2, no S3/S4, no murmur.   No peripheral edema.  No carotid bruit.    Abdomen: Obese, firm.  Skin: Intact without lesions or rashes.  Neurologic: Alert and oriented x 3.  Psych: Normal affect. Extremities: No clubbing or cyanosis.  HEENT: Normal.   ECG: Most recent ECG reviewed.   Labs: Lab Results  Component Value Date/Time   K 4.8 12/31/2014 08:31 AM   BUN 16 12/31/2014 08:31 AM   CREATININE 0.86 12/31/2014 08:31 AM   CREATININE 0.68 03/14/2014 08:21 AM  ALT 29 12/31/2014 08:31 AM   HGB 15.5 07/01/2010 08:26 AM     Lipids: Lab Results  Component Value Date/Time   LDLCALC 97 12/31/2014 08:31 AM   CHOL 154 12/31/2014 08:31 AM   TRIG 133 12/31/2014 08:31 AM   HDL 30 (L) 12/31/2014 08:31 AM        ASSESSMENT AND PLAN:  1.  Coronary artery calcifications/abnormal chest CT: Chest CT reviewed above.  Risk factors for cardiovascular disease include history of tobacco use and hypertension.  He has episodic chest tightness but this appears to coincide with flares of COPD and is seasonal. I will proceed with a nuclear myocardial perfusion imaging study to evaluate for ischemic heart disease (Lexiscan Myoview).  2.  Hypertension: Blood pressure is elevated.  This will need further monitoring.  3.  Tobacco use disorder: He smokes more than 2 packs of cigarettes daily for several years but has cut back to 1 pack daily and wants to quit.     Disposition: Follow up in 2 months (virtual)  Signed: Kate Sable, M.D., F.A.C.C.  09/25/2019, 1:17 PM

## 2019-09-25 NOTE — Telephone Encounter (Signed)
°  Precert needed for: Lexiscan   Location: [facility]    Date: Oct 02, 2019

## 2019-09-25 NOTE — Telephone Encounter (Signed)
Virtual Visit Pre-Appointment Phone Call  "(Name), I am calling you today to discuss your upcoming appointment. We are currently trying to limit exposure to the virus that causes COVID-19 by seeing patients at home rather than in the office."  1. "What is the BEST phone number to call the day of the visit?" - include this in appointment notes  2. Do you have or have access to (through a family member/friend) a smartphone with video capability that we can use for your visit?" a. If yes - list this number in appt notes as cell (if different from BEST phone #) and list the appointment type as a VIDEO visit in appointment notes b. If no - list the appointment type as a PHONE visit in appointment notes  3. Confirm consent - "In the setting of the current Covid19 crisis, you are scheduled for a (phone or video) visit with your provider on (date) at (time).  Just as we do with many in-office visits, in order for you to participate in this visit, we must obtain consent.  If you'd like, I can send this to your mychart (if signed up) or email for you to review.  Otherwise, I can obtain your verbal consent now.  All virtual visits are billed to your insurance company just like a normal visit would be.  By agreeing to a virtual visit, we'd like you to understand that the technology does not allow for your provider to perform an examination, and thus may limit your provider's ability to fully assess your condition. If your provider identifies any concerns that need to be evaluated in person, we will make arrangements to do so.  Finally, though the technology is pretty good, we cannot assure that it will always work on either your or our end, and in the setting of a video visit, we may have to convert it to a phone-only visit.  In either situation, we cannot ensure that we have a secure connection.  Are you willing to proceed?" STAFF: Did the patient verbally acknowledge consent to telehealth visit? Document  YES/NO here: yes 4.   5. Advise patient to be prepared - "Two hours prior to your appointment, go ahead and check your blood pressure, pulse, oxygen saturation, and your weight (if you have the equipment to check those) and write them all down. When your visit starts, your provider will ask you for this information. If you have an Apple Watch or Kardia device, please plan to have heart rate information ready on the day of your appointment. Please have a pen and paper handy nearby the day of the visit as well."  6. Give patient instructions for MyChart download to smartphone OR Doximity/Doxy.me as below if video visit (depending on what platform provider is using)  7. Inform patient they will receive a phone call 15 minutes prior to their appointment time (may be from unknown caller ID) so they should be prepared to answer    TELEPHONE CALL NOTE  Arthur Galvan has been deemed a candidate for a follow-up tele-health visit to limit community exposure during the Covid-19 pandemic. I spoke with the patient via phone to ensure availability of phone/video source, confirm preferred email & phone number, and discuss instructions and expectations.  I reminded Arthur Galvan to be prepared with any vital sign and/or heart rhythm information that could potentially be obtained via home monitoring, at the time of his visit. I reminded Arthur Galvan to expect a phone call  prior to his visit.  Arthur Galvan 09/25/2019 1:54 PM   INSTRUCTIONS FOR DOWNLOADING THE MYCHART APP TO SMARTPHONE  - The patient must first make sure to have activated MyChart and know their login information - If Apple, go to [employer] and type in MyChart in the search bar and download the app. If Android, ask patient to go to [employer] and type in [place] in the search bar and download the app. The app is free but as with any other app downloads, their phone may require them to verify saved payment information or  Apple/Android password.  - The patient will need to then log into the app with their MyChart username and password, and select [facility] as their healthcare provider to link the account. When it is time for your visit, go to the MyChart app, find appointments, and click Begin Video Visit. Be sure to Select Allow for your device to access the Microphone and Camera for your visit. You will then be connected, and your provider will be with you shortly.  **If they have any issues connecting, or need assistance please contact MyChart service desk [phone])**  **If using a computer, in order to ensure the best quality for their visit they will need to use either of the following Internet Browsers: [employer], or Google Chrome**  IF USING DOXIMITY or DOXY.ME - The patient will receive a link just prior to their visit by text.     FULL LENGTH CONSENT FOR TELE-HEALTH VISIT   I hereby voluntarily request, consent and authorize [facility] and its employed or contracted physicians, physician assistants, nurse practitioners or other licensed health care professionals (the Practitioner), to provide me with telemedicine health care services (the Services") as deemed necessary by the treating Practitioner. I acknowledge and consent to receive the Services by the Practitioner via telemedicine. I understand that the telemedicine visit will involve communicating with the Practitioner through live audiovisual communication technology and the disclosure of certain medical information by electronic transmission. I acknowledge that I have been given the opportunity to request an in-person assessment or other available alternative prior to the telemedicine visit and am voluntarily participating in the telemedicine visit.  I understand that I have the right to withhold or withdraw my consent to the use of telemedicine in the course of my care at any time, without affecting my right to future care  or treatment, and that the Practitioner or I may terminate the telemedicine visit at any time. I understand that I have the right to inspect all information obtained and/or recorded in the course of the telemedicine visit and may receive copies of available information for a reasonable fee.  I understand that some of the potential risks of receiving the Services via telemedicine include:   Delay or interruption in medical evaluation due to technological equipment failure or disruption;  Information transmitted may not be sufficient (e.g. poor resolution of images) to allow for appropriate medical decision making by the Practitioner; and/or   In rare instances, security protocols could fail, causing a breach of personal health information.  Furthermore, I acknowledge that it is my responsibility to provide information about my medical history, conditions and care that is complete and accurate to the best of my ability. I acknowledge that Practitioner's advice, recommendations, and/or decision may be based on factors not within their control, such as incomplete or inaccurate data provided by me or distortions of diagnostic images or specimens that may result from electronic  transmissions. I understand that the practice of medicine is not an exact science and that Practitioner makes no warranties or guarantees regarding treatment outcomes. I acknowledge that I will receive a copy of this consent concurrently upon execution via email to the email address I last provided but may also request a printed copy by calling the office of [facility].    I understand that my insurance will be billed for this visit.   I have read or had this consent read to me.  I understand the contents of this consent, which adequately explains the benefits and risks of the Services being provided via telemedicine.   I have been provided ample opportunity to ask questions regarding this consent and the Services and have had  my questions answered to my satisfaction.  I give my informed consent for the services to be provided through the use of telemedicine in my medical care  By participating in this telemedicine visit I agree to the above.

## 2019-09-25 NOTE — Patient Instructions (Signed)
Medication Instructions:  Continue all current medications.  Labwork: none  Testing/Procedures:  Your physician has requested that you have a lexiscan myoview. For further information please visit [email or web address]. Please follow instruction sheet, as given.  Office will contact with results via phone or letter.    Follow-Up: 2 months   Any Other Special Instructions Will Be Listed Below (If Applicable).  If you need a refill on your cardiac medications before your next appointment, please call your pharmacy.

## 2019-10-02 ENCOUNTER — Encounter (HOSPITAL_COMMUNITY): Payer: Self-pay

## 2019-10-02 ENCOUNTER — Encounter (HOSPITAL_BASED_OUTPATIENT_CLINIC_OR_DEPARTMENT_OTHER)
Admission: RE | Admit: 2019-10-02 | Discharge: 2019-10-02 | Disposition: A | Discharge status: Home / Self Care | Payer: BC Managed Care – PPO | Source: Ambulatory Visit | Attending: Cardiovascular Disease | Admitting: Cardiovascular Disease

## 2019-10-02 ENCOUNTER — Encounter (HOSPITAL_COMMUNITY)
Admission: RE | Admit: 2019-10-02 | Discharge: 2019-10-02 | Disposition: A | Discharge status: Home / Self Care | Payer: BC Managed Care – PPO | Source: Ambulatory Visit | Attending: Cardiovascular Disease | Admitting: Cardiovascular Disease

## 2019-10-02 ENCOUNTER — Other Ambulatory Visit: Payer: Self-pay

## 2019-10-02 DIAGNOSIS — I2510 Atherosclerotic heart disease of native coronary artery without angina pectoris: Secondary | ICD-10-CM | POA: Insufficient documentation

## 2019-10-02 DIAGNOSIS — R0789 Other chest pain: Secondary | ICD-10-CM | POA: Insufficient documentation

## 2019-10-02 DIAGNOSIS — I2584 Coronary atherosclerosis due to calcified coronary lesion: Secondary | ICD-10-CM | POA: Diagnosis not present

## 2019-10-02 LAB — NM MYOCAR MULTI W/SPECT W/WALL MOTION / EF
LV dias vol: 87 mL (ref 62–150)
LV sys vol: 31 mL
Peak HR: 102 {beats}/min
RATE: 0.37
Rest HR: 100 {beats}/min
SDS: 14
SRS: 5
SSS: 19
TID: 1.01

## 2019-10-02 MED ORDER — SODIUM CHLORIDE FLUSH 0.9 % IV SOLN
INTRAVENOUS | Status: AC
  Administered 2019-10-02: 10 mL via INTRAVENOUS
  Filled 2019-10-02: qty 10

## 2019-10-02 MED ORDER — TECHNETIUM TC 99M TETROFOSMIN IV KIT
30.0000 | PACK | Freq: Once | INTRAVENOUS | Status: AC
  Administered 2019-10-02: 31 via INTRAVENOUS

## 2019-10-02 MED ORDER — REGADENOSON 0.4 MG/5ML IV SOLN
INTRAVENOUS | Status: AC
  Administered 2019-10-02: 0.4 mg via INTRAVENOUS
  Filled 2019-10-02: qty 5

## 2019-10-02 MED ORDER — TECHNETIUM TC 99M TETROFOSMIN IV KIT
10.0000 | PACK | Freq: Once | INTRAVENOUS | Status: AC | PRN
  Administered 2019-10-02: 11 via INTRAVENOUS

## 2019-10-07 ENCOUNTER — Other Ambulatory Visit: Payer: Self-pay | Admitting: *Deleted

## 2019-10-07 ENCOUNTER — Encounter: Payer: Self-pay | Admitting: *Deleted

## 2019-10-07 ENCOUNTER — Telehealth: Payer: Self-pay | Admitting: *Deleted

## 2019-10-07 DIAGNOSIS — Z01812 Encounter for preprocedural laboratory examination: Secondary | ICD-10-CM

## 2019-10-07 DIAGNOSIS — R0789 Other chest pain: Secondary | ICD-10-CM

## 2019-10-07 MED ORDER — METOPROLOL TARTRATE 100 MG PO TABS: 100.0000 mg | ORAL_TABLET | Freq: Once | ORAL | 0 refills | Status: DC

## 2019-10-07 NOTE — Telephone Encounter (Signed)
Lab order & instructions mailed to patient.  Order in & fwd to scheduling.

## 2019-10-07 NOTE — Telephone Encounter (Signed)
10/06/19 - 5:47 - Patient notified. Copy to pmd. Follow up scheduled for 12/01/2019. He agrees to doing the Coronary CT.   STRESS TEST -  Herminio Commons, MD  10/02/2019 1:10 PM EST  Due to significant artifact with imaging test results are not accurate. In this situation I would recommend coronary CT angiography.

## 2019-10-21 DIAGNOSIS — G4733 Obstructive sleep apnea (adult) (pediatric): Secondary | ICD-10-CM | POA: Diagnosis not present

## 2019-11-21 DIAGNOSIS — G4733 Obstructive sleep apnea (adult) (pediatric): Secondary | ICD-10-CM | POA: Diagnosis not present

## 2019-12-01 ENCOUNTER — Telehealth (INDEPENDENT_AMBULATORY_CARE_PROVIDER_SITE_OTHER): Payer: BC Managed Care – PPO | Admitting: Cardiovascular Disease

## 2019-12-01 ENCOUNTER — Encounter: Payer: Self-pay | Admitting: Cardiovascular Disease

## 2019-12-01 VITALS — BP 132/74 | HR 84 | Ht 69.0 in | Wt 272.0 lb

## 2019-12-01 DIAGNOSIS — I2584 Coronary atherosclerosis due to calcified coronary lesion: Secondary | ICD-10-CM

## 2019-12-01 DIAGNOSIS — I2510 Atherosclerotic heart disease of native coronary artery without angina pectoris: Secondary | ICD-10-CM | POA: Diagnosis not present

## 2019-12-01 DIAGNOSIS — R0789 Other chest pain: Secondary | ICD-10-CM

## 2019-12-01 DIAGNOSIS — I10 Essential (primary) hypertension: Secondary | ICD-10-CM

## 2019-12-01 DIAGNOSIS — Z720 Tobacco use: Secondary | ICD-10-CM

## 2019-12-01 NOTE — Patient Instructions (Signed)
Medication Instructions:  Continue all current medications.  Labwork: none  Testing/Procedures: none  Follow-Up: 2 months   Any Other Special Instructions Will Be Listed Below (If Applicable).  If you need a refill on your cardiac medications before your next appointment, please call your pharmacy.  

## 2019-12-01 NOTE — Progress Notes (Signed)
Virtual Visit via Telephone Note   This visit type was conducted due to national recommendations for restrictions regarding the COVID-19 Pandemic (e.g. social distancing) in an effort to limit this patient's exposure and mitigate transmission in our community.  Due to his co-morbid illnesses, this patient is at least at moderate risk for complications without adequate follow up.  This format is felt to be most appropriate for this patient at this time.  The patient did not have access to video technology/had technical difficulties with video requiring transitioning to audio format only (telephone).  All issues noted in this document were discussed and addressed.  No physical exam could be performed with this format.  Please refer to the patient's chart for his  consent to telehealth for [facility].   Date:  12/01/2019   ID:  Arthur Galvan, DOB 05-Jul-1961, MRN [id number]  Patient Location: Home Provider Location: Office  PCP:  Celene Squibb, MD  Cardiologist:  Kate Sable, MD  Electrophysiologist:  None   Evaluation Performed:  Follow-Up Visit  Chief Complaint: Follow-up after cardiac testing  History of Present Illness:    Arthur Galvan is a 59 y.o. male with COPD who presents for follow-up after undergoing cardiac testing for coronary artery calcifications.  He underwent a Lexiscan Myoview on 10/02/2019 which demonstrated significant artifact and a coronary CT angiogram was recommended which has been ordered.  He has chronic exertional dyspnea related to COPD which is stable.  He denies leg swelling, orthopnea, palpitations, and paroxysmal nocturnal dyspnea.  He is trying to quit smoking and is down to a pack of cigarettes daily or less.  He discussed his family history with me.  His father died of lung cancer.  His mother had coronary artery disease and had stents.  He asked if would be okay to resume testosterone shots.    Social history: His eldest daughter,  Venita Lick, is a Marine scientist and works in the [place] center at [employer].  Past Medical History:  Diagnosis Date  . GERD (gastroesophageal reflux disease)   . Hypercholesteremia   . Hypertension    Past Surgical History:  Procedure Laterality Date  . abscess removed from groin    . COLONOSCOPY N/A 08/13/2014   Procedure: COLONOSCOPY;  Surgeon: Rogene Houston, MD;  [facility];  Service: Endoscopy;  Laterality: N/A;  930-moved to 915 Ann to notify pt     Current Meds  Medication Sig  . amLODipine (NORVASC) 5 MG tablet Take 5 mg by mouth daily.  Marland Kitchen aspirin 325 MG tablet Take 325 mg by mouth daily.  Marland Kitchen buPROPion (WELLBUTRIN XL) 300 MG 24 hr tablet Take 300 mg by mouth daily.  Marland Kitchen CIALIS 20 MG tablet TAKE ONE TABLET TWO HOURS PRIOR TO SEX  . fluticasone (FLONASE) 50 MCG/ACT nasal spray Place into both nostrils daily.  . metoprolol tartrate (LOPRESSOR) 100 MG tablet Take 1 tablet (100 mg total) by mouth once for 1 dose.  . Montelukast Sodium (SINGULAIR PO) Take by mouth.  . olmesartan-hydrochlorothiazide (BENICAR HCT) 40-12.5 MG tablet Take 1 tablet by mouth daily.  . pantoprazole (PROTONIX) 40 MG tablet Take 40 mg by mouth daily.  . sitaGLIPtin-metformin (JANUMET) 50-500 MG tablet Take 1 tablet by mouth 2 (two) times daily with a meal.  . testosterone cypionate (DEPOTESTOTERONE CYPIONATE) 100 MG/ML injection Inject 200 mg into the muscle every 14 (fourteen) days. For IM use only     Allergies:   Patient has no known  allergies.   Social History   Tobacco Use  . Smoking status: Current Every Day Smoker    Packs/day: 1.00    Years: 20.00    Pack years: 20.00    Types: Cigarettes  . Smokeless tobacco: Never Used  . Tobacco comment: cut back from 2 packs to 1  Substance Use Topics  . Alcohol use: No  . Drug use: No     Family Hx: The patient's family history includes Cancer in his father and sister; Heart attack in his paternal grandmother; Heart disease in his  mother; Heart failure in his paternal uncle; Stroke in his paternal uncle. There is no history of Colon cancer.  ROS:   Please see the history of present illness.     All other systems reviewed and are negative.   Prior CV studies:   The following studies were reviewed today:  Nuclear stress test 10/02/2019:   There was no ST segment deviation noted during stress.  Nuclear stress EF: 65%.  Stress images are significantly different than rest images with globally decreased perfusion. However, the patient's heart is not entirely within the field-of-view thus creating what is likely artifact.  LV systolic function is normal with probable normal regional wall motion.  I would recommend an alternative study such as coronary CT angiography for more accurate results.     Labs/Other Tests and Data Reviewed:    EKG:  No ECG reviewed.  Recent Labs: No results found for requested labs within last 8760 hours.   Recent Lipid Panel Lab Results  Component Value Date/Time   CHOL 154 12/31/2014 08:31 AM   TRIG 133 12/31/2014 08:31 AM   HDL 30 (L) 12/31/2014 08:31 AM   CHOLHDL 5.1 (H) 12/31/2014 08:31 AM   CHOLHDL 4.6 03/14/2014 08:21 AM   LDLCALC 97 12/31/2014 08:31 AM    Wt Readings from Last 3 Encounters:  12/01/19 272 lb (123.4 kg)  09/25/19 275 lb (124.7 kg)  03/16/15 274 lb (124.3 kg)     Objective:    Vital Signs:  BP 132/74   Pulse 84   Ht 5\' 9"  (1.753 m)   Wt 272 lb (123.4 kg)   BMI 40.17 kg/m    VITAL SIGNS:  reviewed  ASSESSMENT & PLAN:    1.  Coronary artery calcifications/abnormal chest CT: Nuclear stress test reviewed above with significant artifact.  A coronary CT angiogram has been arranged for this month.  Symptoms are stable.  2.  Hypertension: Blood pressure is normal.  No changes to therapy.  3.  Tobacco use disorder: He smokes more than 2 packs of cigarettes daily for several years but has cut back to 1 pack daily and wants to quit.   COVID-19  Education: The signs and symptoms of COVID-19 were discussed with the patient and how to seek care for testing (follow up with PCP or arrange E-visit).  The importance of social distancing was discussed today.  Time:   Today, I have spent 15 minutes with the patient with telehealth technology discussing the above problems.     Medication Adjustments/Labs and Tests Ordered: Current medicines are reviewed at length with the patient today.  Concerns regarding medicines are outlined above.   Tests Ordered: No orders of the defined types were placed in this encounter.   Medication Changes: No orders of the defined types were placed in this encounter.   Follow Up:  Virtual Visit  in 2 month(s)  Signed, Kate Sable, MD  12/01/2019 8:31 AM  [facility]

## 2019-12-05 ENCOUNTER — Other Ambulatory Visit: Payer: Self-pay

## 2019-12-05 ENCOUNTER — Other Ambulatory Visit (HOSPITAL_COMMUNITY)
Admission: RE | Admit: 2019-12-05 | Discharge: 2019-12-05 | Disposition: A | Discharge status: Home / Self Care | Payer: BC Managed Care – PPO | Source: Ambulatory Visit | Attending: Cardiovascular Disease | Admitting: Cardiovascular Disease

## 2019-12-05 DIAGNOSIS — Z0182 Encounter for allergy testing: Secondary | ICD-10-CM | POA: Insufficient documentation

## 2019-12-05 DIAGNOSIS — E291 Testicular hypofunction: Secondary | ICD-10-CM | POA: Diagnosis not present

## 2019-12-05 DIAGNOSIS — R0789 Other chest pain: Secondary | ICD-10-CM | POA: Insufficient documentation

## 2019-12-05 LAB — BASIC METABOLIC PANEL
Anion gap: 9 (ref 5–15)
BUN: 14 mg/dL (ref 6–20)
CO2: 28 mmol/L (ref 22–32)
Calcium: 9.3 mg/dL (ref 8.9–10.3)
Chloride: 103 mmol/L (ref 98–111)
Creatinine, Ser: 0.83 mg/dL (ref 0.61–1.24)
GFR calc Af Amer: >60 mL/min (ref >60)
GFR calc non Af Amer: >60 mL/min (ref >60)
Glucose, Bld: 133 mg/dL — ABNORMAL HIGH (ref 70–99)
Potassium: 4.6 mmol/L (ref 3.5–5.1)
Sodium: 140 mmol/L (ref 135–145)

## 2019-12-09 ENCOUNTER — Telehealth: Payer: Self-pay | Admitting: *Deleted

## 2019-12-09 NOTE — Telephone Encounter (Signed)
-----   Message from [place] sent at 12/05/2019  1:56 PM EST -----  ----- Message ----- From: Herminio Commons, MD Sent: 12/05/2019  12:48 PM EST To: Massie Maroon, CMA  ok

## 2019-12-09 NOTE — Telephone Encounter (Signed)
Arthur Galvan, [place]  [id number] PM EST    Patient notified. Pre- CT labs - scheduled for this Friday, 12/12/2019. Next OV scheduled with Dr. Claudie Leach for 02/03/2020.

## 2019-12-11 ENCOUNTER — Telehealth (HOSPITAL_COMMUNITY): Payer: Self-pay | Admitting: Emergency Medicine

## 2019-12-11 NOTE — Telephone Encounter (Signed)
Attempting to reach patient regarding CCTA appt tomorrow.  VM box full. Cannot leave new message  mychart also inactive  Marchia Bond RN Navigator Cardiac Imaging [facility] Heart and Vascular Services [phone] Office  [phone] Cell

## 2019-12-12 ENCOUNTER — Ambulatory Visit (HOSPITAL_COMMUNITY)
Admission: RE | Admit: 2019-12-12 | Discharge: 2019-12-12 | Disposition: A | Discharge status: Home / Self Care | Payer: BC Managed Care – PPO | Source: Ambulatory Visit | Attending: Cardiovascular Disease | Admitting: Cardiovascular Disease

## 2019-12-12 ENCOUNTER — Other Ambulatory Visit: Payer: Self-pay

## 2019-12-12 DIAGNOSIS — R0789 Other chest pain: Secondary | ICD-10-CM | POA: Diagnosis not present

## 2019-12-12 DIAGNOSIS — I2510 Atherosclerotic heart disease of native coronary artery without angina pectoris: Secondary | ICD-10-CM | POA: Diagnosis not present

## 2019-12-12 MED ORDER — NITROGLYCERIN 0.4 MG SL SUBL
SUBLINGUAL_TABLET | SUBLINGUAL | Status: AC
  Filled 2019-12-12: qty 2

## 2019-12-12 MED ORDER — NITROGLYCERIN 0.4 MG SL SUBL: 0.8000 mg | SUBLINGUAL_TABLET | Freq: Once | SUBLINGUAL | Status: DC

## 2019-12-12 MED ORDER — IOHEXOL 350 MG/ML SOLN
80.0000 mL | Freq: Once | INTRAVENOUS | Status: AC | PRN
  Administered 2019-12-12: 80 mL via INTRAVENOUS

## 2019-12-16 ENCOUNTER — Telehealth: Payer: Self-pay | Admitting: *Deleted

## 2019-12-16 ENCOUNTER — Other Ambulatory Visit: Payer: Self-pay | Admitting: *Deleted

## 2019-12-16 MED ORDER — ROSUVASTATIN CALCIUM 20 MG PO TABS: 20.0000 mg | ORAL_TABLET | Freq: Every day | ORAL | 6 refills | Status: DC

## 2019-12-16 NOTE — Telephone Encounter (Signed)
Arthur Galvan, [place]  624THL [id number] PM EST    Patient notified. He will try the Rosuvastatin. New prescription sent to [facility] in [place] now. Copy to pcp.

## 2019-12-16 NOTE — Telephone Encounter (Signed)
Herminio Commons, MD  12/14/2019 7:11 PM EST    He has blockages which are not significant enough to require coronary angiography and should be treated medically. As he is already on ASA, would recommend rosuvastatin 20 mg daily as this has pleiotropic effects which are beneficial.

## 2019-12-16 NOTE — Telephone Encounter (Signed)
Herminio Commons, MD  Laurine Blazer, LPN  If he can afford and is able to tolerate, I would prefer rosuvastatin.

## 2019-12-16 NOTE — Telephone Encounter (Signed)
  Arthur Galvan, [place]  624THL [phone] AM EST    Patient states that his pmd has just started him on Pravastatin 80mg  daily. Do you want him to change to the Crestor or continue with Pravastatin?

## 2019-12-16 NOTE — Telephone Encounter (Signed)
Arthur Commons, MD  12/14/2019 7:05 PM EST    Further studies being done to assess significance of blockages.   Arthur Commons, MD  12/12/2019 11:51 AM EST    No acute abnormalities. 4 mm nodule in right lung which is probably an incidental finding but indeterminate. Please forward a copy to his PCP. Await cardiac results.

## 2019-12-21 DIAGNOSIS — G4733 Obstructive sleep apnea (adult) (pediatric): Secondary | ICD-10-CM | POA: Diagnosis not present

## 2019-12-27 DIAGNOSIS — E291 Testicular hypofunction: Secondary | ICD-10-CM | POA: Diagnosis not present

## 2020-01-02 DIAGNOSIS — J069 Acute upper respiratory infection, unspecified: Secondary | ICD-10-CM | POA: Diagnosis not present

## 2020-01-02 DIAGNOSIS — E118 Type 2 diabetes mellitus with unspecified complications: Secondary | ICD-10-CM | POA: Diagnosis not present

## 2020-01-02 DIAGNOSIS — I25118 Atherosclerotic heart disease of native coronary artery with other forms of angina pectoris: Secondary | ICD-10-CM | POA: Diagnosis not present

## 2020-01-02 DIAGNOSIS — F17210 Nicotine dependence, cigarettes, uncomplicated: Secondary | ICD-10-CM | POA: Diagnosis not present

## 2020-01-02 DIAGNOSIS — J393 Upper respiratory tract hypersensitivity reaction, site unspecified: Secondary | ICD-10-CM | POA: Diagnosis not present

## 2020-01-02 DIAGNOSIS — I10 Essential (primary) hypertension: Secondary | ICD-10-CM | POA: Diagnosis not present

## 2020-01-02 DIAGNOSIS — E1169 Type 2 diabetes mellitus with other specified complication: Secondary | ICD-10-CM | POA: Diagnosis not present

## 2020-01-02 DIAGNOSIS — J449 Chronic obstructive pulmonary disease, unspecified: Secondary | ICD-10-CM | POA: Diagnosis not present

## 2020-01-09 DIAGNOSIS — E782 Mixed hyperlipidemia: Secondary | ICD-10-CM | POA: Diagnosis not present

## 2020-01-09 DIAGNOSIS — E291 Testicular hypofunction: Secondary | ICD-10-CM | POA: Diagnosis not present

## 2020-01-09 DIAGNOSIS — I10 Essential (primary) hypertension: Secondary | ICD-10-CM | POA: Diagnosis not present

## 2020-01-09 DIAGNOSIS — Z716 Tobacco abuse counseling: Secondary | ICD-10-CM | POA: Diagnosis not present

## 2020-01-21 DIAGNOSIS — G4733 Obstructive sleep apnea (adult) (pediatric): Secondary | ICD-10-CM | POA: Diagnosis not present

## 2020-01-30 ENCOUNTER — Telehealth: Payer: Self-pay | Admitting: Cardiovascular Disease

## 2020-01-30 NOTE — Telephone Encounter (Signed)
  Patient Consent for Virtual Visit         Arthur Galvan has provided verbal consent on 01/30/2020 for a virtual visit (video or telephone).   CONSENT FOR VIRTUAL VISIT FOR:  Arthur Galvan  By participating in this virtual visit I agree to the following:  I hereby voluntarily request, consent and authorize [place] and its employed or contracted physicians, physician assistants, nurse practitioners or other licensed health care professionals (the Practitioner), to provide me with telemedicine health care services (the "Services") as deemed necessary by the treating Practitioner. I acknowledge and consent to receive the Services by the Practitioner via telemedicine. I understand that the telemedicine visit will involve communicating with the Practitioner through live audiovisual communication technology and the disclosure of certain medical information by electronic transmission. I acknowledge that I have been given the opportunity to request an in-person assessment or other available alternative prior to the telemedicine visit and am voluntarily participating in the telemedicine visit.  I understand that I have the right to withhold or withdraw my consent to the use of telemedicine in the course of my care at any time, without affecting my right to future care or treatment, and that the Practitioner or I may terminate the telemedicine visit at any time. I understand that I have the right to inspect all information obtained and/or recorded in the course of the telemedicine visit and may receive copies of available information for a reasonable fee.  I understand that some of the potential risks of receiving the Services via telemedicine include:  Marland Kitchen Delay or interruption in medical evaluation due to technological equipment failure or disruption; . Information transmitted may not be sufficient (e.g. poor resolution of images) to allow for appropriate medical decision making by the Practitioner;  and/or  . In rare instances, security protocols could fail, causing a breach of personal health information.  Furthermore, I acknowledge that it is my responsibility to provide information about my medical history, conditions and care that is complete and accurate to the best of my ability. I acknowledge that Practitioner's advice, recommendations, and/or decision may be based on factors not within their control, such as incomplete or inaccurate data provided by me or distortions of diagnostic images or specimens that may result from electronic transmissions. I understand that the practice of medicine is not an exact science and that Practitioner makes no warranties or guarantees regarding treatment outcomes. I acknowledge that a copy of this consent can be made available to me via my patient portal ([place]), or I can request a printed copy by calling the office of [place].    I understand that my insurance will be billed for this visit.   I have read or had this consent read to me. . I understand the contents of this consent, which adequately explains the benefits and risks of the Services being provided via telemedicine.  . I have been provided ample opportunity to ask questions regarding this consent and the Services and have had my questions answered to my satisfaction. . I give my informed consent for the services to be provided through the use of telemedicine in my medical care

## 2020-02-03 ENCOUNTER — Encounter: Payer: Self-pay | Admitting: Cardiovascular Disease

## 2020-02-03 ENCOUNTER — Telehealth (INDEPENDENT_AMBULATORY_CARE_PROVIDER_SITE_OTHER): Payer: BC Managed Care – PPO | Admitting: Cardiovascular Disease

## 2020-02-03 VITALS — BP 132/73 | HR 90 | Ht 69.0 in | Wt 262.0 lb

## 2020-02-03 DIAGNOSIS — I2510 Atherosclerotic heart disease of native coronary artery without angina pectoris: Secondary | ICD-10-CM

## 2020-02-03 DIAGNOSIS — R911 Solitary pulmonary nodule: Secondary | ICD-10-CM | POA: Diagnosis not present

## 2020-02-03 DIAGNOSIS — Z716 Tobacco abuse counseling: Secondary | ICD-10-CM

## 2020-02-03 DIAGNOSIS — I10 Essential (primary) hypertension: Secondary | ICD-10-CM | POA: Diagnosis not present

## 2020-02-03 DIAGNOSIS — I2583 Coronary atherosclerosis due to lipid rich plaque: Secondary | ICD-10-CM

## 2020-02-03 NOTE — Patient Instructions (Signed)

## 2020-02-03 NOTE — Progress Notes (Signed)
Virtual Visit via Telephone Note   This visit type was conducted due to national recommendations for restrictions regarding the COVID-19 Pandemic (e.g. social distancing) in an effort to limit this patient's exposure and mitigate transmission in our community.  Due to his co-morbid illnesses, this patient is at least at moderate risk for complications without adequate follow up.  This format is felt to be most appropriate for this patient at this time.  The patient did not have access to video technology/had technical difficulties with video requiring transitioning to audio format only (telephone).  All issues noted in this document were discussed and addressed.  No physical exam could be performed with this format.  Please refer to the patient's chart for his  consent to telehealth for [facility].   The patient was identified using 2 identifiers.  Date:  02/03/2020   ID:  Arthur Galvan, DOB 09-18-1961, MRN [id number]  Patient Location: Home Provider Location: Office  PCP:  Celene Squibb, MD  Cardiologist:  Kate Sable, MD  Electrophysiologist:  None   Evaluation Performed:  Follow-Up Visit  Chief Complaint: Coronary artery disease  History of Present Illness:    Arthur Galvan is a 59 y.o. male with coronary artery disease, hypertension, and a long history of tobacco abuse.  He underwent coronary CT angiography on 12/12/2019 which demonstrated essentially nonobstructive multivessel coronary artery disease.  FFR was abnormal in the distal LAD and distal D1 but these were small caliber vessels and it was felt that medical therapy is likely the best option unless symptoms were refractory.  He is currently taking aspirin 325 mg.  I started him on rosuvastatin.  He said he feels better on rosuvastatin than he did on pravastatin with less myalgias and joint aches.  He feels his energy levels are improved.  He continues to smoke.   Social history:His eldest daughter, Venita Lick, is a Marine scientist and works in the [place] center at [employer].  Past Medical History:  Diagnosis Date  . GERD (gastroesophageal reflux disease)   . Hypercholesteremia   . Hypertension    Past Surgical History:  Procedure Laterality Date  . abscess removed from groin    . COLONOSCOPY N/A 08/13/2014   Procedure: COLONOSCOPY;  Surgeon: Rogene Houston, MD;  [facility];  Service: Endoscopy;  Laterality: N/A;  930-moved to 915 Ann to notify pt     Current Meds  Medication Sig  . amLODipine (NORVASC) 5 MG tablet Take 5 mg by mouth daily.  Marland Kitchen aspirin 325 MG tablet Take 325 mg by mouth daily.  Marland Kitchen buPROPion (WELLBUTRIN XL) 300 MG 24 hr tablet Take 300 mg by mouth daily.  Marland Kitchen CIALIS 20 MG tablet TAKE ONE TABLET TWO HOURS PRIOR TO SEX  . fluticasone (FLONASE) 50 MCG/ACT nasal spray Place into both nostrils daily.  Marland Kitchen levocetirizine (XYZAL) 5 MG tablet Take 5 mg by mouth at bedtime.  . Montelukast Sodium (SINGULAIR PO) Take by mouth.  . olmesartan-hydrochlorothiazide (BENICAR HCT) 40-12.5 MG tablet Take 1 tablet by mouth daily.  . pantoprazole (PROTONIX) 40 MG tablet Take 40 mg by mouth daily.  . rosuvastatin (CRESTOR) 20 MG tablet Take 1 tablet (20 mg total) by mouth daily.  . sitaGLIPtin-metformin (JANUMET) 50-500 MG tablet Take 1 tablet by mouth 2 (two) times daily with a meal.     Allergies:   Patient has no known allergies.   Social History   Tobacco Use  . Smoking status: Current Every  Day Smoker    Packs/day: 1.00    Years: 20.00    Pack years: 20.00    Types: Cigarettes  . Smokeless tobacco: Never Used  . Tobacco comment: cut back from 2 packs to 1  Substance Use Topics  . Alcohol use: No  . Drug use: No     Family Hx: The patient's family history includes Cancer in his father and sister; Heart attack in his paternal grandmother; Heart disease in his mother; Heart failure in his paternal uncle; Stroke in his paternal uncle. There is no history of Colon  cancer.  ROS:   Please see the history of present illness.     All other systems reviewed and are negative.   Prior CV studies:   The following studies were reviewed today:  Coronary CT angiography 12/12/2019:  FINDINGS: FFRct analysis was performed on the original cardiac CT angiogram dataset. Diagrammatic representation of the FFRct analysis is provided in a separate PDF document in PACS. This dictation was created using the PDF document and an interactive 3D model of the results. 3D model is not available in the EMR/PACS. Normal FFR range is >0.80.  1. Left Main: findings 0.99, 0.98 0.97  2. LAD: findings 0.96, 0.81 0.78 3. D1: Findings 0.95, 0.93, 0.78  4. LCX: findings 0.95, 0.94 0.88  5. OM1: findings 0.93, 0.91 0.90; 0.92  6. RCA: findings 0.99, 0.98 0.95  IMPRESSION: FFR abnormal in distal LAD and distal D1 (small caliber vessels); medical therapy likely best option unless refractory symptoms.  IMPRESSION: 1. No acute abnormality. 2. 4 mm peripheral nodule in the right lung. This small nodule is probably an incidental finding but indeterminate. No follow-up needed if patient is low-risk. Non-contrast chest CT can be considered in 12 months if patient is high-risk. This recommendation follows the consensus statement: Guidelines for Management of Incidental Pulmonary Nodules Detected on CT Images: From the Fleischner Society 2017; Radiology 2017; 284:228-243. 3.  Aortic Atherosclerosis (ICD10-I70.0).  Labs/Other Tests and Data Reviewed:    EKG:  No ECG reviewed.  Recent Labs: 12/05/2019: BUN 14; Creatinine, Ser 0.83; Potassium 4.6; Sodium 140   Recent Lipid Panel Lab Results  Component Value Date/Time   CHOL 154 12/31/2014 08:31 AM   TRIG 133 12/31/2014 08:31 AM   HDL 30 (L) 12/31/2014 08:31 AM   CHOLHDL 5.1 (H) 12/31/2014 08:31 AM   CHOLHDL 4.6 03/14/2014 08:21 AM   LDLCALC 97 12/31/2014 08:31 AM    Wt Readings from Last 3 Encounters:   02/03/20 262 lb (118.8 kg)  12/01/19 272 lb (123.4 kg)  09/25/19 275 lb (124.7 kg)     Objective:    Vital Signs:  BP 132/73   Pulse 90   Ht 5\' 9"  (1.753 m)   Wt 262 lb (118.8 kg)   BMI 38.69 kg/m    VITAL SIGNS:  reviewed  ASSESSMENT & PLAN:    1.  Coronary artery disease: He underwent coronary CT angiography on 12/12/2019 which demonstrated essentially nonobstructive multivessel coronary artery disease.  FFR was abnormal in the distal LAD and distal D1 but these were small caliber vessels and it was felt that medical therapy is likely the best option unless symptoms were refractory.  I started rosuvastatin 20 mg daily and he has been tolerating this actually feels better on this than he did on pravastatin.  He is taking aspirin 325 mg and I recommended he reduce this to 81 mg.  I also counseled him on the importance of tobacco cessation.  2.  Hypertension: Blood pressure is normal.  No changes to therapy.  3.  Tobacco use disorder: He is smoking 1 pack of cigarettes daily.  I counseled him on the importance of cessation.  4.  Lung nodule: A 4 mm peripheral small and indeterminate lung nodule was seen in the right lung.  He should undergo annual surveillance and this will be performed by his PCP.     COVID-19 Education: The signs and symptoms of COVID-19 were discussed with the patient and how to seek care for testing (follow up with PCP or arrange E-visit).  The importance of social distancing was discussed today.  Time:   Today, I have spent 20 minutes with the patient with telehealth technology discussing the above problems.     Medication Adjustments/Labs and Tests Ordered: Current medicines are reviewed at length with the patient today.  Concerns regarding medicines are outlined above.   Tests Ordered: No orders of the defined types were placed in this encounter.   Medication Changes: No orders of the defined types were placed in this encounter.   Follow Up:  In  Person in 1 year(s)  Signed, Kate Sable, MD  02/03/2020 8:37 AM    [place]

## 2020-03-22 DIAGNOSIS — G4733 Obstructive sleep apnea (adult) (pediatric): Secondary | ICD-10-CM | POA: Diagnosis not present

## 2020-04-21 DIAGNOSIS — G4733 Obstructive sleep apnea (adult) (pediatric): Secondary | ICD-10-CM | POA: Diagnosis not present

## 2020-05-22 DIAGNOSIS — G4733 Obstructive sleep apnea (adult) (pediatric): Secondary | ICD-10-CM | POA: Diagnosis not present

## 2020-06-22 DIAGNOSIS — G4733 Obstructive sleep apnea (adult) (pediatric): Secondary | ICD-10-CM | POA: Diagnosis not present

## 2020-07-09 DIAGNOSIS — E118 Type 2 diabetes mellitus with unspecified complications: Secondary | ICD-10-CM | POA: Diagnosis not present

## 2020-07-09 DIAGNOSIS — J393 Upper respiratory tract hypersensitivity reaction, site unspecified: Secondary | ICD-10-CM | POA: Diagnosis not present

## 2020-07-09 DIAGNOSIS — J449 Chronic obstructive pulmonary disease, unspecified: Secondary | ICD-10-CM | POA: Diagnosis not present

## 2020-07-09 DIAGNOSIS — I25118 Atherosclerotic heart disease of native coronary artery with other forms of angina pectoris: Secondary | ICD-10-CM | POA: Diagnosis not present

## 2020-07-09 DIAGNOSIS — J069 Acute upper respiratory infection, unspecified: Secondary | ICD-10-CM | POA: Diagnosis not present

## 2020-07-09 DIAGNOSIS — I10 Essential (primary) hypertension: Secondary | ICD-10-CM | POA: Diagnosis not present

## 2020-07-09 DIAGNOSIS — F17210 Nicotine dependence, cigarettes, uncomplicated: Secondary | ICD-10-CM | POA: Diagnosis not present

## 2020-07-14 ENCOUNTER — Other Ambulatory Visit (HOSPITAL_COMMUNITY): Payer: Self-pay

## 2020-07-14 DIAGNOSIS — Z122 Encounter for screening for malignant neoplasm of respiratory organs: Secondary | ICD-10-CM

## 2020-07-14 DIAGNOSIS — Z87891 Personal history of nicotine dependence: Secondary | ICD-10-CM

## 2020-07-14 NOTE — Progress Notes (Signed)
Patient's follow-up LDCT scheduled for 08/20/2020 at 3p. Patient aware.

## 2020-07-20 DIAGNOSIS — F17200 Nicotine dependence, unspecified, uncomplicated: Secondary | ICD-10-CM | POA: Diagnosis not present

## 2020-07-20 DIAGNOSIS — Z716 Tobacco abuse counseling: Secondary | ICD-10-CM | POA: Diagnosis not present

## 2020-07-20 DIAGNOSIS — I10 Essential (primary) hypertension: Secondary | ICD-10-CM | POA: Diagnosis not present

## 2020-07-20 DIAGNOSIS — E782 Mixed hyperlipidemia: Secondary | ICD-10-CM | POA: Diagnosis not present

## 2020-07-22 DIAGNOSIS — G4733 Obstructive sleep apnea (adult) (pediatric): Secondary | ICD-10-CM | POA: Diagnosis not present

## 2020-08-20 ENCOUNTER — Ambulatory Visit (HOSPITAL_COMMUNITY): Payer: BC Managed Care – PPO

## 2020-08-22 DIAGNOSIS — G4733 Obstructive sleep apnea (adult) (pediatric): Secondary | ICD-10-CM | POA: Diagnosis not present

## 2020-08-30 ENCOUNTER — Encounter (HOSPITAL_COMMUNITY): Payer: Self-pay

## 2020-09-10 ENCOUNTER — Ambulatory Visit (HOSPITAL_COMMUNITY)
Admission: RE | Admit: 2020-09-10 | Discharge: 2020-09-10 | Disposition: A | Discharge status: Home / Self Care | Payer: BC Managed Care – PPO | Source: Ambulatory Visit | Attending: Nurse Practitioner | Admitting: Nurse Practitioner

## 2020-09-10 ENCOUNTER — Encounter (HOSPITAL_COMMUNITY): Payer: Self-pay

## 2020-09-10 ENCOUNTER — Other Ambulatory Visit: Payer: Self-pay

## 2020-09-10 DIAGNOSIS — Z122 Encounter for screening for malignant neoplasm of respiratory organs: Secondary | ICD-10-CM | POA: Insufficient documentation

## 2020-09-10 DIAGNOSIS — Z87891 Personal history of nicotine dependence: Secondary | ICD-10-CM | POA: Insufficient documentation

## 2020-09-10 DIAGNOSIS — F17210 Nicotine dependence, cigarettes, uncomplicated: Secondary | ICD-10-CM | POA: Diagnosis not present

## 2020-09-10 NOTE — Progress Notes (Signed)
I have attempted to reach patient regarding follow-up LDCT scan. Unable to reach patient at this time. Detailed VM left asking that the patient call to reschedule follow-up.

## 2020-09-13 ENCOUNTER — Encounter (HOSPITAL_COMMUNITY): Payer: Self-pay

## 2020-09-13 NOTE — Progress Notes (Signed)
Attempted to reach patient regarding follow-up LDCT. Detailed VM left asking that the patient return my phone call to schedule

## 2020-09-21 DIAGNOSIS — G4733 Obstructive sleep apnea (adult) (pediatric): Secondary | ICD-10-CM | POA: Diagnosis not present

## 2020-10-22 DIAGNOSIS — G4733 Obstructive sleep apnea (adult) (pediatric): Secondary | ICD-10-CM | POA: Diagnosis not present

## 2020-11-01 ENCOUNTER — Encounter (HOSPITAL_COMMUNITY): Payer: Self-pay

## 2020-11-01 NOTE — Progress Notes (Signed)
Patient notified of LDCT Lung Cancer Screening Results via mail with the recommendation to follow-up in 12 months. Patient's referring provider has been sent a copy of results. Results are as follows:  IMPRESSION: 1. Lung-RADS 1, negative. Continue annual screening with low-dose chest CT without contrast in 12 months. 2. Coronary artery calcifications.  Aortic Atherosclerosis (ICD10-I70.0) and Emphysema (ICD10-J43.9).

## 2020-11-22 DIAGNOSIS — G4733 Obstructive sleep apnea (adult) (pediatric): Secondary | ICD-10-CM | POA: Diagnosis not present

## 2020-11-29 DIAGNOSIS — J393 Upper respiratory tract hypersensitivity reaction, site unspecified: Secondary | ICD-10-CM | POA: Diagnosis not present

## 2020-11-29 DIAGNOSIS — J069 Acute upper respiratory infection, unspecified: Secondary | ICD-10-CM | POA: Diagnosis not present

## 2020-11-29 DIAGNOSIS — I10 Essential (primary) hypertension: Secondary | ICD-10-CM | POA: Diagnosis not present

## 2020-11-29 DIAGNOSIS — E118 Type 2 diabetes mellitus with unspecified complications: Secondary | ICD-10-CM | POA: Diagnosis not present

## 2020-11-29 DIAGNOSIS — J449 Chronic obstructive pulmonary disease, unspecified: Secondary | ICD-10-CM | POA: Diagnosis not present

## 2020-11-29 DIAGNOSIS — F17210 Nicotine dependence, cigarettes, uncomplicated: Secondary | ICD-10-CM | POA: Diagnosis not present

## 2020-12-17 DIAGNOSIS — E291 Testicular hypofunction: Secondary | ICD-10-CM | POA: Diagnosis not present

## 2020-12-17 DIAGNOSIS — I10 Essential (primary) hypertension: Secondary | ICD-10-CM | POA: Diagnosis not present

## 2020-12-17 DIAGNOSIS — E782 Mixed hyperlipidemia: Secondary | ICD-10-CM | POA: Diagnosis not present

## 2020-12-17 DIAGNOSIS — E1169 Type 2 diabetes mellitus with other specified complication: Secondary | ICD-10-CM | POA: Diagnosis not present

## 2020-12-20 DIAGNOSIS — G4733 Obstructive sleep apnea (adult) (pediatric): Secondary | ICD-10-CM | POA: Diagnosis not present

## 2021-01-14 DIAGNOSIS — G4733 Obstructive sleep apnea (adult) (pediatric): Secondary | ICD-10-CM | POA: Diagnosis not present

## 2021-01-20 DIAGNOSIS — G4733 Obstructive sleep apnea (adult) (pediatric): Secondary | ICD-10-CM | POA: Diagnosis not present

## 2021-01-25 NOTE — Progress Notes (Signed)
Date:  02/02/2021   ID:  Arthur Galvan, DOB 09-May-1961, MRN [id number]   PCP:  Celene Squibb, MD  Cardiologist:  Kate Sable, MD (Inactive)  Electrophysiologist:  None   Chief Complaint: Coronary artery disease  History of Present Illness:    Arthur Galvan is a 60 y.o. male with coronary artery disease, hypertension, and a long history of tobacco abuse. New to me previously seen by SK  He underwent coronary CT angiography on 12/12/2019 which demonstrated essentially nonobstructive multivessel coronary artery disease. Calcium score was 433 or 92 percentile  FFR was abnormal in the distal LAD and distal D1 but these were small caliber vessels and it was felt that medical therapy is likely the best option unless symptoms were refractory. Of note Myovue done 10/02/2019 Was sub-optimal with normalization artifact EF 65%   He is currently taking aspirin and statin He said he feels better on rosuvastatin than he did on pravastatin with less myalgias and joint aches.  He continues to smoke. Counseled on cessation < 10 minutes  He has lost weight with wife who just had bariatric surgery at [place]  Having trouble getting crestor refilled at [facility] office    Social history:His eldest daughter, Venita Lick, is a Marine scientist and works in the cancer center at [employer].  Past Medical History:  Diagnosis Date  . GERD (gastroesophageal reflux disease)   . Hypercholesteremia   . Hypertension    Past Surgical History:  Procedure Laterality Date  . abscess removed from groin    . COLONOSCOPY N/A 08/13/2014   Procedure: COLONOSCOPY;  Surgeon: Rogene Houston, MD;  [facility];  Service: Endoscopy;  Laterality: N/A;  930-moved to 915 Ann to notify pt     Current Meds  Medication Sig  . amLODipine (NORVASC) 5 MG tablet Take 5 mg by mouth daily.  Marland Kitchen aspirin 325 MG tablet Take 325 mg by mouth daily.  Marland Kitchen buPROPion (WELLBUTRIN XL) 300 MG 24 hr tablet Take 300 mg by  mouth daily.  Marland Kitchen CIALIS 20 MG tablet TAKE ONE TABLET TWO HOURS PRIOR TO SEX  . levocetirizine (XYZAL) 5 MG tablet Take 5 mg by mouth at bedtime.  . Montelukast Sodium (SINGULAIR PO) Take by mouth.  . olmesartan-hydrochlorothiazide (BENICAR HCT) 40-12.5 MG tablet Take 1 tablet by mouth daily.  . pantoprazole (PROTONIX) 40 MG tablet Take 40 mg by mouth daily.  . rosuvastatin (CRESTOR) 20 MG tablet Take 1 tablet (20 mg total) by mouth daily.  . sitaGLIPtin-metformin (JANUMET) 50-500 MG tablet Take 1 tablet by mouth 2 (two) times daily with a meal.     Allergies:   Patient has no known allergies.   Social History   Tobacco Use  . Smoking status: Current Every Day Smoker    Packs/day: 1.00    Years: 20.00    Pack years: 20.00    Types: Cigarettes  . Smokeless tobacco: Never Used  . Tobacco comment: cut back from 2 packs to 1  Vaping Use  . Vaping Use: Never used  Substance Use Topics  . Alcohol use: No  . Drug use: No     Family Hx: The patient's family history includes Cancer in his father and sister; Heart attack in his paternal grandmother; Heart disease in his mother; Heart failure in his paternal uncle; Stroke in his paternal uncle. There is no history of Colon cancer.  ROS:   Please see the history of present illness.  All other systems reviewed and are negative.   Prior CV studies:   The following studies were reviewed today:  Coronary CT angiography 12/12/2019:  FINDINGS: FFRct analysis was performed on the original cardiac CT angiogram dataset. Diagrammatic representation of the FFRct analysis is provided in a separate PDF document in PACS. This dictation was created using the PDF document and an interactive 3D model of the results. 3D model is not available in the EMR/PACS. Normal FFR range is >0.80.  1. Left Main: findings 0.99, 0.98 0.97  2. LAD: findings 0.96, 0.81 0.78 3. D1: Findings 0.95, 0.93, 0.78  4. LCX: findings 0.95, 0.94 0.88  5. OM1:  findings 0.93, 0.91 0.90; 0.92  6. RCA: findings 0.99, 0.98 0.95  IMPRESSION: FFR abnormal in distal LAD and distal D1 (small caliber vessels); medical therapy likely best option unless refractory symptoms.  IMPRESSION: 1. No acute abnormality. 2. 4 mm peripheral nodule in the right lung. This small nodule is probably an incidental finding but indeterminate. No follow-up needed if patient is low-risk. Non-contrast chest CT can be considered in 12 months if patient is high-risk. This recommendation follows the consensus statement: Guidelines for Management of Incidental Pulmonary Nodules Detected on CT Images: From the Fleischner Society 2017; Radiology 2017; 284:228-243. 3.  Aortic Atherosclerosis (ICD10-I70.0).  Labs/Other Tests and Data Reviewed:    EKG:   09/25/19 SR rate 90 low precordial voltage 02/02/2021 SR rate 90 normal   Recent Labs: No results found for requested labs within last 8760 hours.   Recent Lipid Panel Lab Results  Component Value Date/Time   CHOL 154 12/31/2014 08:31 AM   TRIG 133 12/31/2014 08:31 AM   HDL 30 (L) 12/31/2014 08:31 AM   CHOLHDL 5.1 (H) 12/31/2014 08:31 AM   CHOLHDL 4.6 03/14/2014 08:21 AM   LDLCALC 97 12/31/2014 08:31 AM    Wt Readings from Last 3 Encounters:  02/02/21 116.5 kg  02/03/20 118.8 kg  12/01/19 123.4 kg     Objective:    Vital Signs:  BP 124/80   Pulse 96   Ht 5\' 8"  (1.727 m)   Wt 116.5 kg   SpO2 94%   BMI 39.05 kg/m     ASSESSMENT & PLAN:    1.  Coronary artery disease: small vessel disease in distal LAD and small distal D1 by CT/FFR 12/12/19 On ASA and statin stable no angina SL nitro  called in   2.  Hypertension: Blood pressure is normal.  No changes to therapy.  3.  Tobacco use disorder: He is smoking 1 pack of cigarettes daily.  I counseled him on the importance of cessation. Discussed use of Flonase during pollen season and called in Albuterol inhaler for him   4.  Lung nodule: calcified granuloma RUL  stable by CT 09/13/20   5. HLD:  Refill for crestor called in     Medication Adjustments/Labs and Tests Ordered: Current medicines are reviewed at length with the patient today.  Concerns regarding medicines are outlined above.   Tests Ordered: No orders of the defined types were placed in this encounter.   Medication Changes: No orders of the defined types were placed in this encounter.   Follow Up:  In Person in 1 year(s)  Signed, Jenkins Rouge, MD  02/02/2021 2:18 PM    [place]

## 2021-01-30 IMAGING — CT CT CHEST LUNG CANCER SCREENING LOW DOSE W/O CM
1 of 3 series · 10 of 30 positions shown, 13 images · non-contrast
Comparison: No priors.

CLINICAL DATA: 58-year-old male current smoker with 77 pack-year
history of smoking. Lung cancer screening examination.

EXAM:
CT CHEST WITHOUT CONTRAST LOW-DOSE FOR LUNG CANCER SCREENING
TECHNIQUE: Multidetector CT imaging of the chest was performed following the
standard protocol without IV contrast.

[ct lung segmentation data · axial · 0.81mm/px · z∈[+1113,+1113]mm · 10 of 280 frames shown]
[frame 1/280  mediastinal]
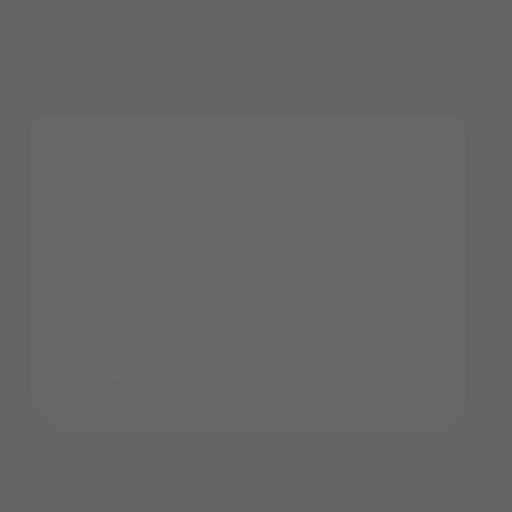
[frame 1/280  lung]
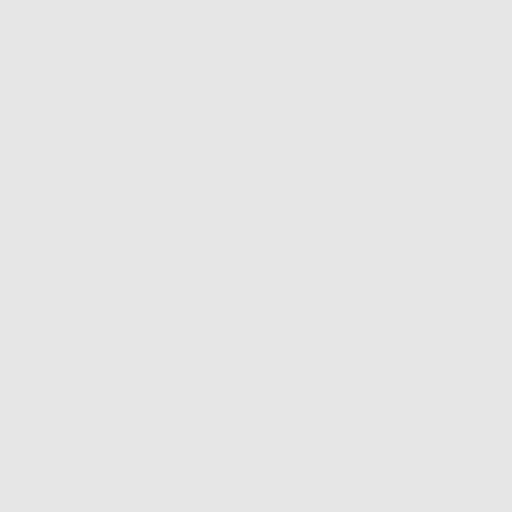
[frame 32/280  lung]
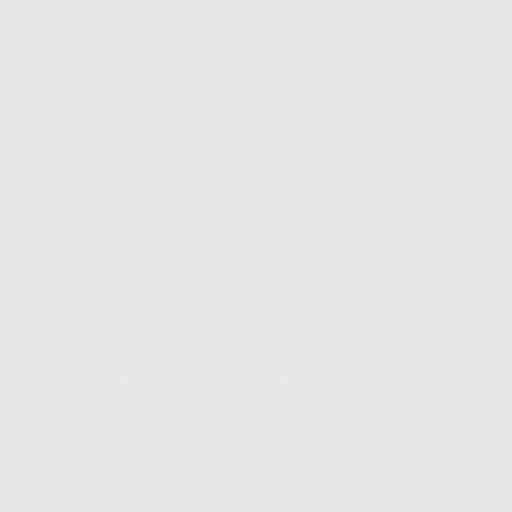
[frame 63/280  lung]
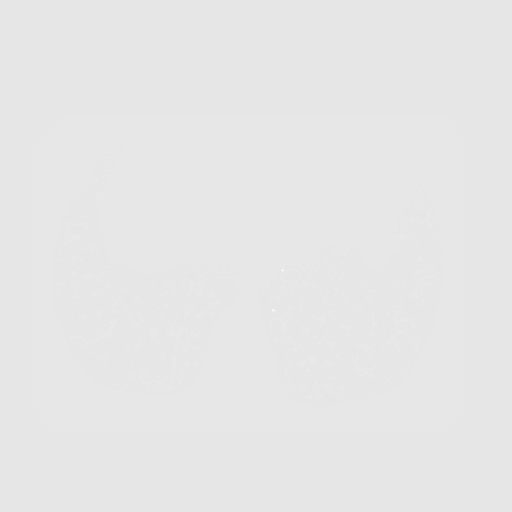
[frame 94/280  lung]
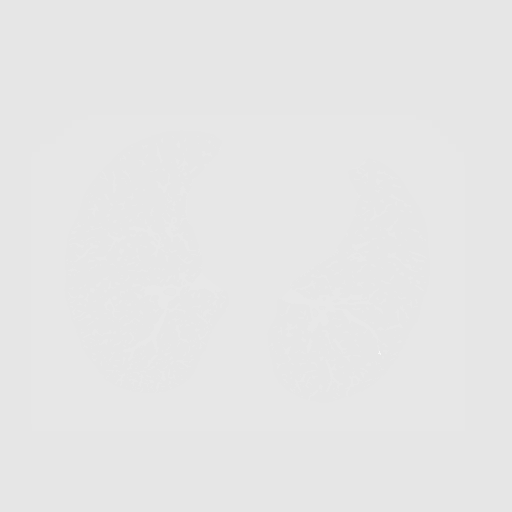
[frame 125/280  mediastinal]
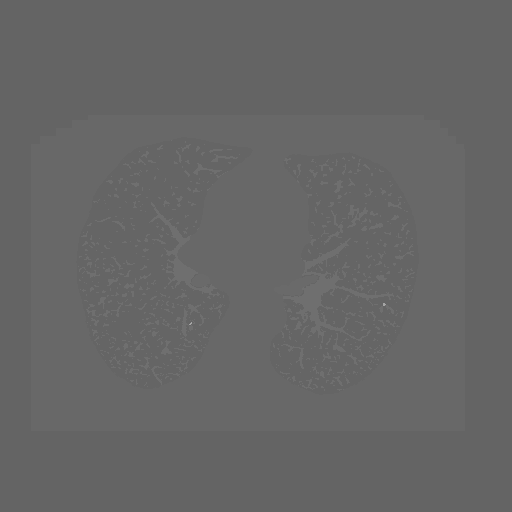
[frame 125/280  lung]
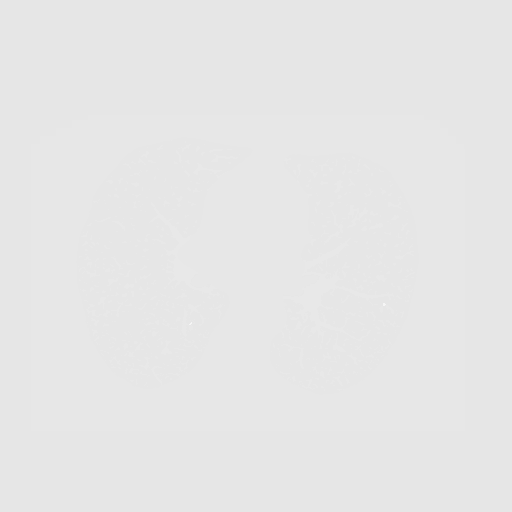
[frame 156/280  lung]
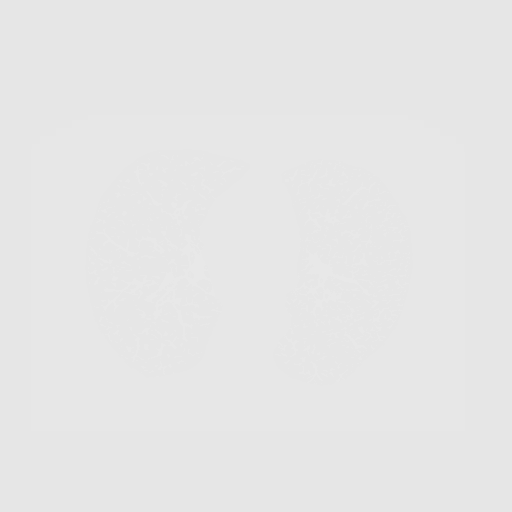
[frame 187/280  lung]
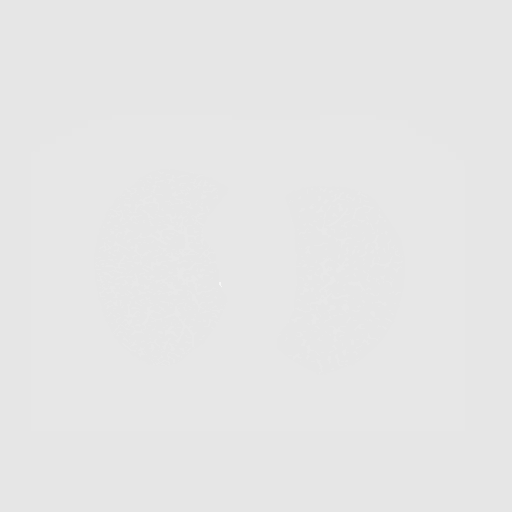
[frame 218/280  lung]
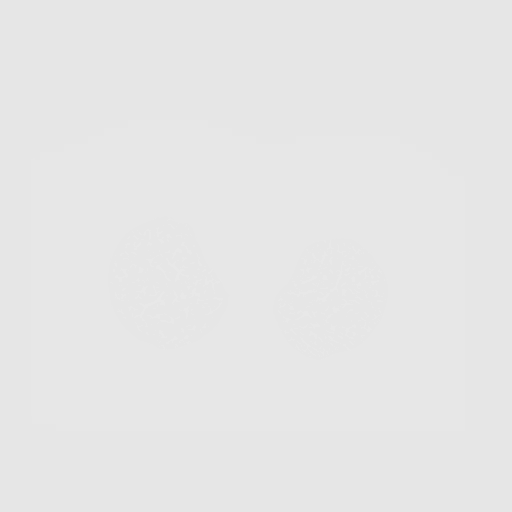
[frame 249/280  mediastinal]
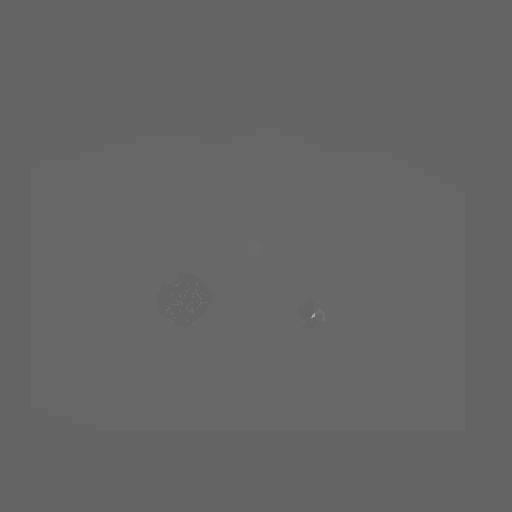
[frame 249/280  lung]
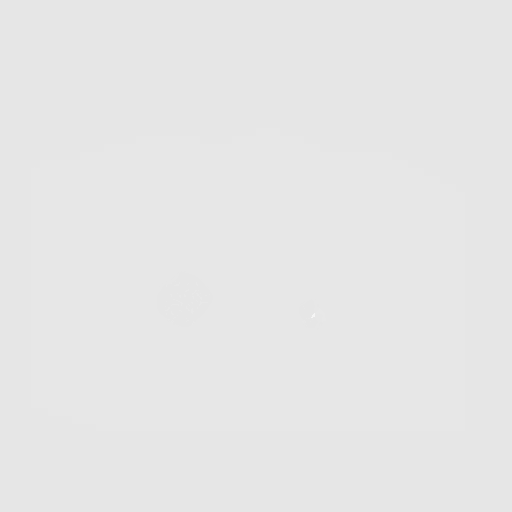
[frame 280/280  lung]
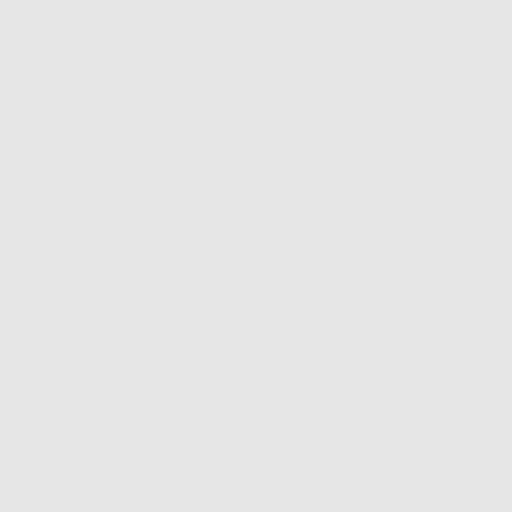

[10 of 30 positions shown; findings below may reference images not displayed]

FINDINGS: Cardiovascular: Heart size is normal. There is no significant
pericardial fluid, thickening or pericardial calcification. There is
aortic atherosclerosis, as well as atherosclerosis of the great
vessels of the mediastinum and the coronary arteries, including
calcified atherosclerotic plaque in the left anterior descending and
left circumflex coronary arteries.

Mediastinum/Nodes: No pathologically enlarged mediastinal or hilar
lymph nodes. Please note that accurate exclusion of hilar adenopathy
is limited on noncontrast CT scans. Esophagus is unremarkable in
appearance. No axillary lymphadenopathy.

Lungs/Pleura: Tiny calcified granuloma in the right upper lobe. No
other suspicious appearing pulmonary nodules or masses are noted. No
acute consolidative airspace disease. No pleural effusions. Mild
diffuse bronchial wall thickening with mild centrilobular and
paraseptal emphysema.

Upper Abdomen: Diffuse low attenuation throughout the visualized
portions of the hepatic parenchyma, indicative of hepatic steatosis.
Incompletely imaged exophytic low-attenuation lesion in the upper
pole the left kidney measuring 3.8 cm in diameter, similar to the
prior study, but not characterized on today's non-contrast CT
examination, statistically likely to represent a cyst.

Musculoskeletal: There are no aggressive appearing lytic or blastic
lesions noted in the visualized portions of the skeleton.
IMPRESSION: 1. Lung-RADS 1S, negative. Continue annual screening with low-dose
chest CT without contrast in 12 months.
2. The "S" modifier above refers to potentially clinically
significant non lung cancer related findings. Specifically, there is
aortic atherosclerosis, in addition to 2 vessel coronary artery
disease. Please note that although the presence of coronary artery
calcium documents the presence of coronary artery disease, the
severity of this disease and any potential stenosis cannot be
assessed on this non-gated CT examination. Assessment for potential
risk factor modification, dietary therapy or pharmacologic therapy
may be warranted, if clinically indicated.
3. Mild diffuse bronchial wall thickening with mild centrilobular
and paraseptal emphysema; imaging findings suggestive of underlying
COPD.
4. Hepatic steatosis.

Aortic Atherosclerosis (ILI5Z-MFP.P) and Emphysema (ILI5Z-VAM.1).

## 2021-02-02 ENCOUNTER — Other Ambulatory Visit: Payer: Self-pay

## 2021-02-02 ENCOUNTER — Ambulatory Visit: Payer: BC Managed Care – PPO | Admitting: Cardiovascular Disease

## 2021-02-02 ENCOUNTER — Encounter: Payer: Self-pay | Admitting: Cardiovascular Disease

## 2021-02-02 VITALS — BP 124/80 | HR 96 | Ht 68.0 in | Wt 256.8 lb

## 2021-02-02 DIAGNOSIS — F17200 Nicotine dependence, unspecified, uncomplicated: Secondary | ICD-10-CM

## 2021-02-02 DIAGNOSIS — I10 Essential (primary) hypertension: Secondary | ICD-10-CM | POA: Diagnosis not present

## 2021-02-02 DIAGNOSIS — I2510 Atherosclerotic heart disease of native coronary artery without angina pectoris: Secondary | ICD-10-CM

## 2021-02-02 MED ORDER — NITROGLYCERIN 0.4 MG SL SUBL: 0.4000 mg | SUBLINGUAL_TABLET | SUBLINGUAL | 3 refills | Status: AC | PRN

## 2021-02-02 MED ORDER — ROSUVASTATIN CALCIUM 20 MG PO TABS: 20.0000 mg | ORAL_TABLET | Freq: Every day | ORAL | 3 refills | Status: DC

## 2021-02-02 MED ORDER — ALBUTEROL SULFATE HFA 108 (90 BASE) MCG/ACT IN AERS: 2.0000 | INHALATION_SPRAY | Freq: Four times a day (QID) | RESPIRATORY_TRACT | 2 refills | Status: DC | PRN

## 2021-02-02 NOTE — Patient Instructions (Signed)
Medication Instructions:  Your physician recommends that you continue on your current medications as directed. Please refer to the Current Medication list given to you today.  Start Nitro as needed for chest pain.   *If you need a refill on your cardiac medications before your next appointment, please call your pharmacy*   Lab Work: NONE   If you have labs (blood work) drawn today and your tests are completely normal, you will receive your results only by: Marland Kitchen MyChart Message (if you have MyChart) OR . A paper copy in the mail If you have any lab test that is abnormal or we need to change your treatment, we will call you to review the results.   Testing/Procedures: NONE   Follow-Up: At [facility], you and your health needs are our priority.  As part of our continuing mission to provide you with exceptional heart care, we have created designated Provider Care Teams.  These Care Teams include your primary Cardiologist (physician) and Advanced Practice Providers (APPs -  Physician Assistants and Nurse Practitioners) who all work together to provide you with the care you need, when you need it.  We recommend signing up for the patient portal called "MyChart".  Sign up information is provided on this After Visit Summary.  MyChart is used to connect with patients for Virtual Visits (Telemedicine).  Patients are able to view lab/test results, encounter notes, upcoming appointments, etc.  Non-urgent messages can be sent to your provider as well.   To learn more about what you can do with MyChart, go to [email or web address].    Your next appointment:   1 year(s)  The format for your next appointment:   In Person  Provider:   Jenkins Rouge, MD   Other Instructions Thank you for choosing [facility]!  Nitroglycerin sublingual tablets What is this medicine? NITROGLYCERIN (nye troe GLI ser in) is a type of vasodilator. It relaxes blood vessels, increasing the blood and  oxygen supply to your heart. This medicine is used to relieve chest pain caused by angina. It is also used to prevent chest pain before activities like climbing stairs, going outdoors in cold weather, or sexual activity. This medicine may be used for other purposes; ask your health care provider or pharmacist if you have questions. COMMON BRAND NAME(S): Nitroquick, Nitrostat, Nitrotab What should I tell my health care provider before I take this medicine? They need to know if you have any of these conditions:  anemia  head injury, recent stroke, or bleeding in the brain  liver disease  previous heart attack  an unusual or allergic reaction to nitroglycerin, other medicines, foods, dyes, or preservatives  pregnant or trying to get pregnant  breast-feeding How should I use this medicine? Take this medicine by mouth as needed. Use at the first sign of an angina attack (chest pain or tightness). You can also take this medicine 5 to 10 minutes before an event likely to produce chest pain. Follow the directions exactly as written on the prescription label. Place one tablet under your tongue and let it dissolve. Do not swallow whole. Replace the dose if you accidentally swallow it. It will help if your mouth is not dry. Saliva around the tablet will help it to dissolve more quickly. Do not eat or drink, smoke or chew tobacco while a tablet is dissolving. Sit down when taking this medicine. In an angina attack, you should feel better within 5 minutes after your first dose. You can take a dose  every 5 minutes up to a total of 3 doses. If you do not feel better or feel worse after 1 dose, call 9-1-1 at once. Do not take more than 3 doses in 15 minutes. Your health care provider might give you other directions. Follow those directions if he or she does. Do not take your medicine more often than directed. Talk to your health care provider about the use of this medicine in children. Special care may be  needed. Overdosage: If you think you have taken too much of this medicine contact a poison control center or emergency room at once. NOTE: This medicine is only for you. Do not share this medicine with others. What if I miss a dose? This does not apply. This medicine is only used as needed. What may interact with this medicine? Do not take this medicine with any of the following medications:  certain migraine medicines like ergotamine and dihydroergotamine (DHE)  medicines used to treat erectile dysfunction like sildenafil, tadalafil, and vardenafil  riociguat This medicine may also interact with the following medications:  alteplase  aspirin  heparin  medicines for high blood pressure  medicines for mental depression  other medicines used to treat angina  phenothiazines like chlorpromazine, mesoridazine, prochlorperazine, thioridazine This list may not describe all possible interactions. Give your health care provider a list of all the medicines, herbs, non-prescription drugs, or dietary supplements you use. Also tell them if you smoke, drink alcohol, or use illegal drugs. Some items may interact with your medicine. What should I watch for while using this medicine? Tell your doctor or health care professional if you feel your medicine is no longer working. Keep this medicine with you at all times. Sit or lie down when you take your medicine to prevent falling if you feel dizzy or faint after using it. Try to remain calm. This will help you to feel better faster. If you feel dizzy, take several deep breaths and lie down with your feet propped up, or bend forward with your head resting between your knees. You may get drowsy or dizzy. Do not drive, use machinery, or do anything that needs mental alertness until you know how this drug affects you. Do not stand or sit up quickly, especially if you are an older patient. This reduces the risk of dizzy or fainting spells. Alcohol can make  you more drowsy and dizzy. Avoid alcoholic drinks. Do not treat yourself for coughs, colds, or pain while you are taking this medicine without asking your doctor or health care professional for advice. Some ingredients may increase your blood pressure. What side effects may I notice from receiving this medicine? Side effects that you should report to your doctor or health care professional as soon as possible:  allergic reactions (skin rash, itching or hives; swelling of the face, lips, or tongue)  low blood pressure (dizziness; feeling faint or lightheaded, falls; unusually weak or tired)  low red blood cell counts (trouble breathing; feeling faint; lightheaded, falls; unusually weak or tired) Side effects that usually do not require medical attention (report to your doctor or health care professional if they continue or are bothersome):  facial flushing (redness)  headache  nausea, vomiting This list may not describe all possible side effects. Call your doctor for medical advice about side effects. You may report side effects to FDA at 1-800-FDA-1088. Where should I keep my medicine? Keep out of the reach of children. Store at room temperature between 20 and 25 degrees C (  68 and 77 degrees F). Store in [facility] of [employer]. Protect from light and moisture. Keep tightly closed. Throw away any unused medicine after the expiration date. NOTE: This sheet is a summary. It may not cover all possible information. If you have questions about this medicine, talk to your doctor, pharmacist, or health care provider.  2021 Elsevier/Gold Standard (2018-07-10 16:46:32)

## 2021-02-19 DIAGNOSIS — G4733 Obstructive sleep apnea (adult) (pediatric): Secondary | ICD-10-CM | POA: Diagnosis not present

## 2021-03-22 DIAGNOSIS — G4733 Obstructive sleep apnea (adult) (pediatric): Secondary | ICD-10-CM | POA: Diagnosis not present

## 2021-04-15 DIAGNOSIS — I10 Essential (primary) hypertension: Secondary | ICD-10-CM | POA: Diagnosis not present

## 2021-04-15 DIAGNOSIS — E1169 Type 2 diabetes mellitus with other specified complication: Secondary | ICD-10-CM | POA: Diagnosis not present

## 2021-04-21 DIAGNOSIS — G4733 Obstructive sleep apnea (adult) (pediatric): Secondary | ICD-10-CM | POA: Diagnosis not present

## 2021-04-28 ENCOUNTER — Other Ambulatory Visit: Payer: Self-pay | Admitting: Cardiovascular Disease

## 2021-05-09 DIAGNOSIS — E782 Mixed hyperlipidemia: Secondary | ICD-10-CM | POA: Diagnosis not present

## 2021-05-09 DIAGNOSIS — E1165 Type 2 diabetes mellitus with hyperglycemia: Secondary | ICD-10-CM | POA: Diagnosis not present

## 2021-05-09 DIAGNOSIS — D72829 Elevated white blood cell count, unspecified: Secondary | ICD-10-CM | POA: Diagnosis not present

## 2021-05-09 DIAGNOSIS — Z716 Tobacco abuse counseling: Secondary | ICD-10-CM | POA: Diagnosis not present

## 2021-05-09 DIAGNOSIS — F17200 Nicotine dependence, unspecified, uncomplicated: Secondary | ICD-10-CM | POA: Diagnosis not present

## 2021-05-09 DIAGNOSIS — K219 Gastro-esophageal reflux disease without esophagitis: Secondary | ICD-10-CM | POA: Diagnosis not present

## 2021-05-22 DIAGNOSIS — G4733 Obstructive sleep apnea (adult) (pediatric): Secondary | ICD-10-CM | POA: Diagnosis not present

## 2021-06-22 DIAGNOSIS — G4733 Obstructive sleep apnea (adult) (pediatric): Secondary | ICD-10-CM | POA: Diagnosis not present

## 2021-07-22 DIAGNOSIS — G4733 Obstructive sleep apnea (adult) (pediatric): Secondary | ICD-10-CM | POA: Diagnosis not present

## 2021-08-17 DIAGNOSIS — K219 Gastro-esophageal reflux disease without esophagitis: Secondary | ICD-10-CM | POA: Diagnosis not present

## 2021-08-17 DIAGNOSIS — D72829 Elevated white blood cell count, unspecified: Secondary | ICD-10-CM | POA: Diagnosis not present

## 2021-08-17 DIAGNOSIS — E782 Mixed hyperlipidemia: Secondary | ICD-10-CM | POA: Diagnosis not present

## 2021-08-17 DIAGNOSIS — Z23 Encounter for immunization: Secondary | ICD-10-CM | POA: Diagnosis not present

## 2021-08-17 DIAGNOSIS — E1165 Type 2 diabetes mellitus with hyperglycemia: Secondary | ICD-10-CM | POA: Diagnosis not present

## 2021-08-22 DIAGNOSIS — G4733 Obstructive sleep apnea (adult) (pediatric): Secondary | ICD-10-CM | POA: Diagnosis not present

## 2021-09-21 DIAGNOSIS — G4733 Obstructive sleep apnea (adult) (pediatric): Secondary | ICD-10-CM | POA: Diagnosis not present

## 2021-09-30 DIAGNOSIS — G4733 Obstructive sleep apnea (adult) (pediatric): Secondary | ICD-10-CM | POA: Diagnosis not present

## 2021-10-22 DIAGNOSIS — G4733 Obstructive sleep apnea (adult) (pediatric): Secondary | ICD-10-CM | POA: Diagnosis not present

## 2021-10-25 ENCOUNTER — Other Ambulatory Visit: Payer: Self-pay | Admitting: Cardiovascular Disease

## 2021-10-26 NOTE — Telephone Encounter (Signed)
This is a [place] pt.  °

## 2021-11-22 DIAGNOSIS — G4733 Obstructive sleep apnea (adult) (pediatric): Secondary | ICD-10-CM | POA: Diagnosis not present

## 2021-12-19 DIAGNOSIS — E1165 Type 2 diabetes mellitus with hyperglycemia: Secondary | ICD-10-CM | POA: Diagnosis not present

## 2021-12-19 DIAGNOSIS — E782 Mixed hyperlipidemia: Secondary | ICD-10-CM | POA: Diagnosis not present

## 2021-12-20 DIAGNOSIS — G4733 Obstructive sleep apnea (adult) (pediatric): Secondary | ICD-10-CM | POA: Diagnosis not present

## 2021-12-21 DIAGNOSIS — E1165 Type 2 diabetes mellitus with hyperglycemia: Secondary | ICD-10-CM | POA: Diagnosis not present

## 2021-12-21 DIAGNOSIS — D72829 Elevated white blood cell count, unspecified: Secondary | ICD-10-CM | POA: Diagnosis not present

## 2021-12-21 DIAGNOSIS — K219 Gastro-esophageal reflux disease without esophagitis: Secondary | ICD-10-CM | POA: Diagnosis not present

## 2021-12-21 DIAGNOSIS — E782 Mixed hyperlipidemia: Secondary | ICD-10-CM | POA: Diagnosis not present

## 2022-01-20 DIAGNOSIS — G4733 Obstructive sleep apnea (adult) (pediatric): Secondary | ICD-10-CM | POA: Diagnosis not present

## 2022-02-19 DIAGNOSIS — G4733 Obstructive sleep apnea (adult) (pediatric): Secondary | ICD-10-CM | POA: Diagnosis not present

## 2022-03-22 DIAGNOSIS — G4733 Obstructive sleep apnea (adult) (pediatric): Secondary | ICD-10-CM | POA: Diagnosis not present

## 2022-03-24 DIAGNOSIS — E1165 Type 2 diabetes mellitus with hyperglycemia: Secondary | ICD-10-CM | POA: Diagnosis not present

## 2022-03-24 DIAGNOSIS — E782 Mixed hyperlipidemia: Secondary | ICD-10-CM | POA: Diagnosis not present

## 2022-03-31 DIAGNOSIS — E1165 Type 2 diabetes mellitus with hyperglycemia: Secondary | ICD-10-CM | POA: Diagnosis not present

## 2022-03-31 DIAGNOSIS — D72829 Elevated white blood cell count, unspecified: Secondary | ICD-10-CM | POA: Diagnosis not present

## 2022-03-31 DIAGNOSIS — E782 Mixed hyperlipidemia: Secondary | ICD-10-CM | POA: Diagnosis not present

## 2022-03-31 DIAGNOSIS — K219 Gastro-esophageal reflux disease without esophagitis: Secondary | ICD-10-CM | POA: Diagnosis not present

## 2022-04-21 DIAGNOSIS — G4733 Obstructive sleep apnea (adult) (pediatric): Secondary | ICD-10-CM | POA: Diagnosis not present

## 2022-05-22 DIAGNOSIS — G4733 Obstructive sleep apnea (adult) (pediatric): Secondary | ICD-10-CM | POA: Diagnosis not present

## 2022-06-07 NOTE — Progress Notes (Unsigned)
Date:  06/07/2022   ID:  Arthur Galvan, DOB [birth year], MRN [id number]   PCP:  Arthur Squibb, MD  Cardiologist:  Arthur Galvan Electrophysiologist:  None   Chief Complaint: Coronary artery disease  History of Present Illness:    Arthur Galvan is a 61 y.o. male with coronary artery disease, hypertension, and a long history of tobacco abuse.   He underwent coronary CT angiography on 12/12/2019 which demonstrated essentially nonobstructive multivessel coronary artery disease. Calcium score was 433 or 92 percentile  FFR was abnormal in the distal LAD and distal D1 but these were small caliber vessels and it was felt that medical therapy is likely the best option unless symptoms were refractory. Of note Myovue done 10/02/2019 Was sub-optimal with normalization artifact EF 65%   He is currently taking aspirin and statin He said he feels better on rosuvastatin than he did on pravastatin with less myalgias and joint aches.  He continues to smoke. Counseled on cessation < 10 minutes  He has lost weight with wife who had bariatric surgery at [place] cancer CT 09/13/20 negative   ***   Social history: His eldest daughter, Arthur Galvan, is a Marine scientist and works in the [place] center at [employer].  Past Medical History:  Diagnosis Date   GERD (gastroesophageal reflux disease)    Hypercholesteremia    Hypertension    Past Surgical History:  Procedure Laterality Date   abscess removed from groin     COLONOSCOPY N/A 08/13/2014   Procedure: COLONOSCOPY;  Surgeon: Rogene [place], MD;  Location: [facility];  Service: Endoscopy;  Laterality: N/A;  930-moved to 915 Ann to notify pt     No outpatient medications have been marked as taking for the 06/08/22 encounter (Appointment) with Josue Hector, MD.     Allergies:   Patient has no known allergies.   Social History   Tobacco Use   Smoking status: Every Day    Packs/day: 1.00    Years: 20.00    Total pack years:  20.00    Types: Cigarettes   Smokeless tobacco: Never   Tobacco comments:    cut back from 2 packs to 1  Vaping Use   Vaping Use: Never used  Substance Use Topics   Alcohol use: No   Drug use: No     Family Hx: The patient's family history includes Cancer in his father and sister; Heart attack in his paternal grandmother; Heart disease in his mother; Heart failure in his paternal uncle; Stroke in his paternal uncle. There is no history of Colon cancer.  ROS:   Please see the history of present illness.     All other systems reviewed and are negative.   Prior CV studies:   The following studies were reviewed today:  Coronary CT angiography 12/12/2019:  FINDINGS: FFRct analysis was performed on the original cardiac CT angiogram dataset. Diagrammatic representation of the FFRct analysis is provided in a separate PDF document in PACS. This dictation was created using the PDF document and an interactive 3D model of the results. 3D model is not available in the EMR/PACS. Normal FFR range is >0.80.   1. Left Main: findings 0.99, 0.98 0.97   2. LAD: findings 0.96, 0.81 0.78 3. D1: Findings 0.95, 0.93, 0.78   4. LCX: findings 0.95, 0.94 0.88   5. OM1: findings 0.93, 0.91 0.90; 0.92   6. RCA: findings 0.99, 0.98 0.95   IMPRESSION: FFR abnormal  in distal LAD and distal D1 (small caliber vessels); medical therapy likely best option unless refractory symptoms.  IMPRESSION: 1. No acute abnormality. 2. 4 mm peripheral nodule in the right lung. This small nodule is probably an incidental finding but indeterminate. No follow-up needed if patient is low-risk. Non-contrast chest CT can be considered in 12 months if patient is high-risk. This recommendation follows the consensus statement: Guidelines for Management of Incidental Pulmonary Nodules Detected on CT Images: From the Fleischner Society 2017; Radiology 2017; 284:228-243. 3.  Aortic Atherosclerosis  (ICD10-I70.0).  Labs/Other Tests and Data Reviewed:    EKG:   09/25/19 SR rate 90 low precordial voltage 06/07/2022 SR rate 90 normal   Recent Labs: No results found for requested labs within last 365 days.   Recent Lipid Panel Lab Results  Component Value Date/Time   CHOL 154 12/31/2014 08:31 AM   TRIG 133 12/31/2014 08:31 AM   HDL 30 (L) 12/31/2014 08:31 AM   CHOLHDL 5.1 (H) 12/31/2014 08:31 AM   CHOLHDL 4.6 03/14/2014 08:21 AM   LDLCALC 97 12/31/2014 08:31 AM    Wt Readings from Last 3 Encounters:  02/02/21 256 lb 12.8 oz (116.5 kg)  02/03/20 262 lb (118.8 kg)  12/01/19 272 lb (123.4 kg)     Objective:    Vital Signs:  There were no vitals taken for this visit.    ASSESSMENT & PLAN:    1.  Coronary artery disease: small vessel disease in distal LAD and small distal D1 by CT/FFR 12/12/19 On ASA and statin stable no angina SL nitro  called in   2.  Hypertension: Blood pressure is normal.  No changes to therapy.  3.  Tobacco use disorder: He is smoking 1 pack of cigarettes daily.  I counseled him on the importance of cessation. Discussed use of Flonase during pollen season and called in Albuterol inhaler for him   4.  Lung nodule: calcified granuloma RUL stable by CT 09/13/20 Update CT  5. HLD:  continue crestor     Medication Adjustments/Labs and Tests Ordered: Current medicines are reviewed at length with the patient today.  Concerns regarding medicines are outlined above.   Tests Ordered: No orders of the defined types were placed in this encounter.   Medication Changes: No orders of the defined types were placed in this encounter. CT lungs cancer screening   Follow Up:  In Person in 1 year(s)  Signed, Jenkins Rouge, MD  06/07/2022 10:24 AM    [place]

## 2022-06-08 ENCOUNTER — Ambulatory Visit: Payer: BC Managed Care – PPO | Admitting: Cardiovascular Disease

## 2022-06-08 ENCOUNTER — Encounter: Payer: Self-pay | Admitting: Cardiovascular Disease

## 2022-06-08 VITALS — BP 132/84 | HR 81 | Ht 69.0 in | Wt 275.0 lb

## 2022-06-08 DIAGNOSIS — G4733 Obstructive sleep apnea (adult) (pediatric): Secondary | ICD-10-CM

## 2022-06-08 DIAGNOSIS — I2510 Atherosclerotic heart disease of native coronary artery without angina pectoris: Secondary | ICD-10-CM | POA: Diagnosis not present

## 2022-06-08 DIAGNOSIS — Z720 Tobacco use: Secondary | ICD-10-CM

## 2022-06-08 DIAGNOSIS — I10 Essential (primary) hypertension: Secondary | ICD-10-CM | POA: Diagnosis not present

## 2022-06-08 NOTE — Patient Instructions (Signed)
Medication Instructions:  ?Your physician recommends that you continue on your current medications as directed. Please refer to the Current Medication list given to you today. ? ?*If you need a refill on your cardiac medications before your next appointment, please call your pharmacy* ? ? ?Lab Work: ?NONE  ? ?If you have labs (blood work) drawn today and your tests are completely normal, you will receive your results only by: ?MyChart Message (if you have MyChart) OR ?A paper copy in the mail ?If you have any lab test that is abnormal or we need to change your treatment, we will call you to review the results. ? ? ?Testing/Procedures: ?NONE  ? ? ?Follow-Up: ?At [facility], you and your health needs are our priority.  As part of our continuing mission to provide you with exceptional heart care, we have created designated Provider Care Teams.  These Care Teams include your primary Cardiologist (physician) and Advanced Practice Providers (APPs -  Physician Assistants and Nurse Practitioners) who all work together to provide you with the care you need, when you need it. ? ?We recommend signing up for the patient portal called "MyChart".  Sign up information is provided on this After Visit Summary.  MyChart is used to connect with patients for Virtual Visits (Telemedicine).  Patients are able to view lab/test results, encounter notes, upcoming appointments, etc.  Non-urgent messages can be sent to your provider as well.   ?To learn more about what you can do with MyChart, go to [email or web address].   ? ?Your next appointment:   ?1 year(s) ? ?The format for your next appointment:   ?In Person ? ?Provider:   ?Peter Nishan, MD  ? ? ?Other Instructions ?Thank you for choosing [facility]! ? ? ? ?Important Information About Sugar ? ? ? ? ? ? ?

## 2022-06-22 DIAGNOSIS — G4733 Obstructive sleep apnea (adult) (pediatric): Secondary | ICD-10-CM | POA: Diagnosis not present

## 2022-06-29 ENCOUNTER — Ambulatory Visit (HOSPITAL_COMMUNITY)
Admission: RE | Admit: 2022-06-29 | Discharge: 2022-06-29 | Disposition: A | Discharge status: Home / Self Care | Payer: BC Managed Care – PPO | Source: Ambulatory Visit | Attending: Cardiovascular Disease | Admitting: Cardiovascular Disease

## 2022-06-29 DIAGNOSIS — E1165 Type 2 diabetes mellitus with hyperglycemia: Secondary | ICD-10-CM | POA: Diagnosis not present

## 2022-06-29 DIAGNOSIS — E782 Mixed hyperlipidemia: Secondary | ICD-10-CM | POA: Diagnosis not present

## 2022-06-29 DIAGNOSIS — Z720 Tobacco use: Secondary | ICD-10-CM | POA: Diagnosis not present

## 2022-06-29 DIAGNOSIS — F17210 Nicotine dependence, cigarettes, uncomplicated: Secondary | ICD-10-CM | POA: Diagnosis not present

## 2022-07-07 DIAGNOSIS — E782 Mixed hyperlipidemia: Secondary | ICD-10-CM | POA: Diagnosis not present

## 2022-07-07 DIAGNOSIS — E1165 Type 2 diabetes mellitus with hyperglycemia: Secondary | ICD-10-CM | POA: Diagnosis not present

## 2022-07-07 DIAGNOSIS — K219 Gastro-esophageal reflux disease without esophagitis: Secondary | ICD-10-CM | POA: Diagnosis not present

## 2022-07-07 DIAGNOSIS — D72829 Elevated white blood cell count, unspecified: Secondary | ICD-10-CM | POA: Diagnosis not present

## 2022-07-07 DIAGNOSIS — Z23 Encounter for immunization: Secondary | ICD-10-CM | POA: Diagnosis not present

## 2022-07-12 ENCOUNTER — Telehealth: Payer: Self-pay

## 2022-07-12 NOTE — Telephone Encounter (Signed)
Patient notified and verbalized understanding. Patient had no questions or concerns at this time. PCP copied 

## 2022-07-12 NOTE — Telephone Encounter (Signed)
-----   Message from Michaelyn Barter, RN sent at 07/12/2022  1:06 PM EDT -----  ----- Message ----- From: Josue Hector, MD Sent: 07/08/2022   9:13 AM EDT To: Michaelyn Barter, RN  No cancer repeat scan in a year

## 2022-07-19 ENCOUNTER — Ambulatory Visit (INDEPENDENT_AMBULATORY_CARE_PROVIDER_SITE_OTHER): Payer: BC Managed Care – PPO

## 2022-07-19 ENCOUNTER — Other Ambulatory Visit: Payer: Self-pay | Admitting: Podiatry

## 2022-07-19 ENCOUNTER — Ambulatory Visit: Payer: BC Managed Care – PPO | Admitting: Podiatry

## 2022-07-19 DIAGNOSIS — Z01818 Encounter for other preprocedural examination: Secondary | ICD-10-CM

## 2022-07-19 DIAGNOSIS — M25872 Other specified joint disorders, left ankle and foot: Secondary | ICD-10-CM | POA: Diagnosis not present

## 2022-07-19 DIAGNOSIS — M2042 Other hammer toe(s) (acquired), left foot: Secondary | ICD-10-CM

## 2022-07-19 DIAGNOSIS — M79672 Pain in left foot: Secondary | ICD-10-CM

## 2022-07-19 DIAGNOSIS — S98132A Complete traumatic amputation of one left lesser toe, initial encounter: Secondary | ICD-10-CM

## 2022-07-19 NOTE — Progress Notes (Signed)
Subjective:  Patient ID: Arthur Galvan, male    DOB: 06-26-1961,  MRN: [id number]  Chief Complaint  Patient presents with   Foot Pain    Left foot 2nd toe pain     61 y.o. male presents with the above complaint.  Patient presents with complaint left second digit pain.  Patient states pain for touch is progressive gotten worse.  He has severe bunion deformity with predislocation syndrome of the second.  He states the second toe hurts on top of the foot hurts with ambulation worse with pressure he would like to discuss options for it.  He does not have diabetes.  His pain scale 7 out of 10 hurts with ambulation hurts with pressure.  He denies any other acute complaints.  He would like to discuss surgical options at this time he is tried all conservative treatment options include shoe gear modification padding protecting offloading   Review of Systems: Negative except as noted in the HPI. Denies N/V/F/Ch.  Past Medical History:  Diagnosis Date   GERD (gastroesophageal reflux disease)    Hypercholesteremia    Hypertension     Current Outpatient Medications:    albuterol (VENTOLIN HFA) 108 (90 Base) MCG/ACT inhaler, INHALE 2 PUFFS INTO THE LUNGS EVERY 6 HOURS AS NEEDED FOR WHEEZING OR SHORTNESS OF BREATH, Disp: 6.7 g, Rfl: 1   amLODipine (NORVASC) 5 MG tablet, Take 5 mg by mouth daily., Disp: , Rfl:    aspirin 325 MG tablet, Take 325 mg by mouth daily., Disp: , Rfl:    buPROPion (WELLBUTRIN XL) 300 MG 24 hr tablet, Take 300 mg by mouth daily., Disp: , Rfl:    CIALIS 20 MG tablet, TAKE ONE TABLET TWO HOURS PRIOR TO SEX, Disp: 8 tablet, Rfl: 1   levocetirizine (XYZAL) 5 MG tablet, Take 5 mg by mouth at bedtime., Disp: , Rfl:    metFORMIN (GLUCOPHAGE) 1000 MG tablet, Take 1 tablet by mouth 2 (two) times daily., Disp: , Rfl:    Montelukast Sodium (SINGULAIR PO), Take by mouth., Disp: , Rfl:    nitroGLYCERIN (NITROSTAT) 0.4 MG SL tablet, Place 1 tablet (0.4 mg total) under the tongue every 5  (five) minutes as needed for chest pain., Disp: 25 tablet, Rfl: 3   olmesartan-hydrochlorothiazide (BENICAR HCT) 40-12.5 MG tablet, Take 1 tablet by mouth daily., Disp: , Rfl:    pantoprazole (PROTONIX) 40 MG tablet, Take 40 mg by mouth daily., Disp: , Rfl:    rosuvastatin (CRESTOR) 20 MG tablet, TAKE 1 TABLET(20 MG) BY MOUTH DAILY, Disp: 90 tablet, Rfl: 3   RYBELSUS 7 MG TABS, Take 1 tablet by mouth daily., Disp: , Rfl:   Social History   Tobacco Use  Smoking Status Every Day   Packs/day: 1.00   Years: 20.00   Total pack years: 20.00   Types: Cigarettes  Smokeless Tobacco Never  Tobacco Comments   cut back from 2 packs to 1    No Known Allergies Objective:  There were no vitals filed for this visit. There is no height or weight on file to calculate BMI. Constitutional Well developed. Well nourished.  Vascular Dorsalis pedis pulses palpable bilaterally. Posterior tibial pulses palpable bilaterally. Capillary refill normal to all digits.  No cyanosis or clubbing noted. Pedal hair growth normal.  Neurologic Normal speech. Oriented to person, place, and time. Epicritic sensation to light touch grossly present bilaterally.  Dermatologic Nails well groomed and normal in appearance. No open wounds. No skin lesions.  Orthopedic: Pain on palpation  to the left second digit dorsal aspect of the PIPJ joint.  Severe predislocation syndrome noted of the second toe.  Hammertoe contracture of third digit noted.  Bunion deformity noted with arthritis in the first metatarsophalangeal joint   Radiographs: 3 views of skeletally mature adult left foot:Severe second digit hammertoe contracture noted with third digit lateral deviation/hammertoe contracture noted.  Osteoarthritis of the first metatarsophalangeal joint with underlying moderate bunion deformity noted plantar and posterior heel spurring noted midfoot arthritis noted. Assessment:   1. 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 Hammertoe of second toe of left foot   3. Encounter for preoperative examination for general surgical procedure    Plan:  Patient was evaluated and treated and all questions answered.  Left second digit severe predislocation syndrome -All questions and concerns were discussed with the patient extensive detail.  I discussed with the patient extensive detail that he would also benefit from a forefoot reconstruction however recovery is very long.  His pain is primarily located at the second digit and therefore I believe patient may benefit from elective second digit amputation due to the pain.  Given the severity of the dislocation he would benefit more from an amputation as opposed to reconstructing the entire forefoot.  I discussed this with the patient he states understand like to proceed with an elective amputation -I discussed my preoperative intra postop plan in extensive detail he understands and would like to proceed with surgery -Informed surgical risk consent was reviewed and read aloud to the patient.  I reviewed the films.  I have discussed my findings with the patient in great detail.  I have discussed all risks including but not limited to infection, stiffness, scarring, limp, disability, deformity, damage to blood vessels and nerves, numbness, poor healing, need for braces, arthritis, chronic pain, amputation, death.  All benefits and realistic expectations discussed in great detail.  I have made no promises as to the outcome.  I have provided realistic expectations.  I have offered the patient a 2nd opinion, which they have declined and assured me they preferred to proceed despite the risks   No follow-ups on file.   Left second digit amputation elective. Huge dformity

## 2022-07-22 DIAGNOSIS — G4733 Obstructive sleep apnea (adult) (pediatric): Secondary | ICD-10-CM | POA: Diagnosis not present

## 2022-08-11 ENCOUNTER — Ambulatory Visit: Payer: BC Managed Care – PPO | Admitting: Cardiovascular Disease

## 2022-08-22 DIAGNOSIS — G4733 Obstructive sleep apnea (adult) (pediatric): Secondary | ICD-10-CM | POA: Diagnosis not present

## 2022-08-25 ENCOUNTER — Telehealth: Payer: Self-pay | Admitting: Podiatry

## 2022-08-25 NOTE — Telephone Encounter (Signed)
DOS: 09/25/2022  [employer]  Procedure: Amputation Toe MPJ Joint 2nd Lt (08676)  DX: M20.42  Deductible: $800 with $0 remaining Out-of-Pocket: $4,800 with $3,806 remaining CoInsurance: 0%  Per Babs Bertin, I need to contact [employer] to obtain prior authorization or see if prior authorization is required.  Call Reference #: [id number] [place] with Babs Bertin  Prior Authorization is Required per Babs Sciara. Clinicals need to be faxed to [phone]  Call Reference #: [id number]  [place] Authorization #: [id number]

## 2022-09-21 DIAGNOSIS — G4733 Obstructive sleep apnea (adult) (pediatric): Secondary | ICD-10-CM | POA: Diagnosis not present

## 2022-09-25 ENCOUNTER — Encounter: Payer: Self-pay | Admitting: Podiatry

## 2022-09-25 ENCOUNTER — Other Ambulatory Visit: Payer: Self-pay | Admitting: Podiatry

## 2022-09-25 DIAGNOSIS — S93145A Subluxation of metatarsophalangeal joint of left lesser toe(s), initial encounter: Secondary | ICD-10-CM | POA: Diagnosis not present

## 2022-09-25 DIAGNOSIS — L84 Corns and callosities: Secondary | ICD-10-CM | POA: Diagnosis not present

## 2022-09-25 DIAGNOSIS — M24478 Recurrent dislocation, left toe(s): Secondary | ICD-10-CM | POA: Diagnosis not present

## 2022-09-25 DIAGNOSIS — M2042 Other hammer toe(s) (acquired), left foot: Secondary | ICD-10-CM

## 2022-09-25 MED ORDER — OXYCODONE-ACETAMINOPHEN 5-325 MG PO TABS: 1.0000 | ORAL_TABLET | ORAL | 0 refills | Status: DC | PRN

## 2022-09-25 MED ORDER — IBUPROFEN 800 MG PO TABS: 800.0000 mg | ORAL_TABLET | Freq: Four times a day (QID) | ORAL | 1 refills | Status: DC | PRN

## 2022-10-04 ENCOUNTER — Ambulatory Visit (INDEPENDENT_AMBULATORY_CARE_PROVIDER_SITE_OTHER): Payer: BC Managed Care – PPO

## 2022-10-04 ENCOUNTER — Ambulatory Visit: Payer: BC Managed Care – PPO

## 2022-10-04 ENCOUNTER — Encounter: Payer: BC Managed Care – PPO | Admitting: Podiatry

## 2022-10-04 VITALS — BP 140/78

## 2022-10-04 DIAGNOSIS — Z9889 Other specified postprocedural states: Secondary | ICD-10-CM | POA: Diagnosis not present

## 2022-10-04 NOTE — Progress Notes (Signed)
Post op #1 DOS 12.4.23  LEFT 2ND DIGIT AMPUTATION    Patient is in office today for first post op visit. Denies any fever chills nausea or vomiting at this time. Pt stated that he has slight discomfort. No signs of infection noted at this time. Sutures dry intact.   Clean bandages was applied today. X-rays were taken.  All questions and concerns were answered. Advised patient that if any signs of infection occurred to go to the ER.  Patient is to follow up with Dr.Patel in 2 weeks.

## 2022-10-11 ENCOUNTER — Other Ambulatory Visit: Payer: Self-pay | Admitting: Podiatry

## 2022-10-11 DIAGNOSIS — Z9889 Other specified postprocedural states: Secondary | ICD-10-CM

## 2022-10-18 ENCOUNTER — Ambulatory Visit (INDEPENDENT_AMBULATORY_CARE_PROVIDER_SITE_OTHER): Payer: BC Managed Care – PPO | Admitting: Podiatry

## 2022-10-18 DIAGNOSIS — Z9889 Other specified postprocedural states: Secondary | ICD-10-CM

## 2022-10-18 NOTE — Progress Notes (Signed)
It  Subjective:  Patient ID: Arthur Galvan, male    DOB: 10/23/1961,  MRN: [id number]  Chief Complaint  Patient presents with   Routine Post Op    POV #2 DOS 09/25/2022 LT 2ND DIGIT ELECTIVE AMPUTATION    DOS: 09/25/2022 Procedure: Left second digit amputation  61 y.o. male returns for post-op check.  States that he is doing well.  No acute complaints.  Bandages clean dry and intact  Review of Systems: Negative except as noted in the HPI. Denies N/V/F/Ch.  Past Medical History:  Diagnosis Date   GERD (gastroesophageal reflux disease)    Hypercholesteremia    Hypertension     Current Outpatient Medications:    albuterol (VENTOLIN HFA) 108 (90 Base) MCG/ACT inhaler, INHALE 2 PUFFS INTO THE LUNGS EVERY 6 HOURS AS NEEDED FOR WHEEZING OR SHORTNESS OF BREATH, Disp: 6.7 g, Rfl: 1   amLODipine (NORVASC) 5 MG tablet, Take 5 mg by mouth daily., Disp: , Rfl:    aspirin 325 MG tablet, Take 325 mg by mouth daily., Disp: , Rfl:    buPROPion (WELLBUTRIN XL) 300 MG 24 hr tablet, Take 300 mg by mouth daily., Disp: , Rfl:    CIALIS 20 MG tablet, TAKE ONE TABLET TWO HOURS PRIOR TO SEX, Disp: 8 tablet, Rfl: 1   ibuprofen (ADVIL) 800 MG tablet, Take 1 tablet (800 mg total) by mouth every 6 (six) hours as needed., Disp: 60 tablet, Rfl: 1   levocetirizine (XYZAL) 5 MG tablet, Take 5 mg by mouth at bedtime., Disp: , Rfl:    metFORMIN (GLUCOPHAGE) 1000 MG tablet, Take 1 tablet by mouth 2 (two) times daily., Disp: , Rfl:    Montelukast Sodium (SINGULAIR PO), Take by mouth., Disp: , Rfl:    nitroGLYCERIN (NITROSTAT) 0.4 MG SL tablet, Place 1 tablet (0.4 mg total) under the tongue every 5 (five) minutes as needed for chest pain., Disp: 25 tablet, Rfl: 3   olmesartan-hydrochlorothiazide (BENICAR HCT) 40-12.5 MG tablet, Take 1 tablet by mouth daily., Disp: , Rfl:    oxyCODONE-acetaminophen (PERCOCET) 5-325 MG tablet, Take 1 tablet by mouth every 4 (four) hours as needed for severe pain., Disp: 30 tablet, Rfl:  0   pantoprazole (PROTONIX) 40 MG tablet, Take 40 mg by mouth daily., Disp: , Rfl:    rosuvastatin (CRESTOR) 20 MG tablet, TAKE 1 TABLET(20 MG) BY MOUTH DAILY, Disp: 90 tablet, Rfl: 3   RYBELSUS 7 MG TABS, Take 1 tablet by mouth daily., Disp: , Rfl:   Social History   Tobacco Use  Smoking Status Every Day   Packs/day: 1.00   Years: 20.00   Total pack years: 20.00   Types: Cigarettes  Smokeless Tobacco Never  Tobacco Comments   cut back from 2 packs to 1    No Known Allergies Objective:  There were no vitals filed for this visit. There is no height or weight on file to calculate BMI. Constitutional Well developed. Well nourished.  Vascular Foot warm and well perfused. Capillary refill normal to all digits.   Neurologic Normal speech. Oriented to person, place, and time. Epicritic sensation to light touch grossly present bilaterally.  Dermatologic Yes open skin completely epithelialized.  She amputation site is healing well  Orthopedic: Tenderness to palpation noted about the surgical site.   Radiographs: None Assessment:  No diagnosis found. Plan:  Patient was evaluated and treated and all questions answered.  S/p foot surgery left -Progressing as expected post-operatively. -XR: See above -WB Status: Weightbearing as tolerated in  surgical shoe -Sutures: Intact.  No clinical signs of dehiscence noted no complication noted. -Medications: None -Foot redressed.  No follow-ups on file.

## 2022-10-26 ENCOUNTER — Other Ambulatory Visit: Payer: Self-pay | Admitting: Cardiovascular Disease

## 2022-12-31 ENCOUNTER — Other Ambulatory Visit: Payer: Self-pay | Admitting: Podiatry

## 2023-05-14 ENCOUNTER — Other Ambulatory Visit: Payer: Self-pay | Admitting: Podiatry

## 2023-10-12 ENCOUNTER — Other Ambulatory Visit (HOSPITAL_COMMUNITY): Payer: Self-pay | Admitting: Family Medicine

## 2023-10-12 DIAGNOSIS — F17200 Nicotine dependence, unspecified, uncomplicated: Secondary | ICD-10-CM

## 2023-10-25 NOTE — Progress Notes (Signed)
 Date:  11/06/2023   ID:  Arthur Galvan, DOB 02/09/1961, MRN [id number]   PCP:  Shona Norleen PEDLAR, MD  Cardiologist:  [place] Electrophysiologist:  None   Chief Complaint: Coronary artery disease  History of Present Illness:    Arthur Galvan is a 63 y.o. male with coronary artery disease, hypertension, and a long history of tobacco abuse.   He underwent coronary CT angiography on 12/12/2019 which demonstrated essentially nonobstructive multivessel coronary artery disease. Calcium  score was 433 or 92 percentile  FFR was abnormal in the distal LAD and distal D1 but these were small caliber vessels and it was felt that medical therapy is likely the best option unless symptoms were refractory. Of note Myovue done 10/02/2019 Was sub-optimal with normalization artifact EF 65%   He is currently taking aspirin and statin He said he feels better on rosuvastatin  than he did on pravastatin  with less myalgias and joint aches.  He continues to smoke. Counseled on cessation < 10 minutes  Weight is up. 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.  Past Medical History:  Diagnosis Date   GERD (gastroesophageal reflux disease)    Hypercholesteremia    Hypertension    Past Surgical History:  Procedure Laterality Date   abscess removed from groin     COLONOSCOPY N/A 08/13/2014   Procedure: COLONOSCOPY;  Surgeon: Claudis RAYMOND Rivet, MD;  Location: [facility];  Service: Endoscopy;  Laterality: N/A;  930-moved to 915 Ann to notify pt     Current Meds  Medication Sig   albuterol  (VENTOLIN  HFA) 108 (90 Base) MCG/ACT inhaler INHALE 2 PUFFS INTO THE LUNGS EVERY 6  HOURS AS NEEDED FOR WHEEZING OR SHORTNESS OF BREATH   aspirin 325 MG tablet Take 325 mg by mouth daily.   buPROPion  (WELLBUTRIN  XL) 300 MG 24 hr tablet Take 300 mg by mouth daily.   CIALIS  20 MG tablet TAKE ONE TABLET TWO HOURS PRIOR TO SEX   ibuprofen  (ADVIL ) 800 MG tablet TAKE 1 TABLET(800 MG) BY MOUTH EVERY 6 HOURS AS NEEDED   icosapent Ethyl (VASCEPA) 1 g capsule Take 2 capsules twice a day by oral route for 90 days.   Montelukast Sodium (SINGULAIR PO) Take by mouth.   nitroGLYCERIN  (NITROSTAT ) 0.4 MG SL tablet Place 1 tablet (0.4 mg total) under the tongue every 5 (five) minutes as needed for chest pain.   olmesartan-hydrochlorothiazide (BENICAR HCT) 40-12.5 MG tablet Take 1 tablet by mouth daily.   pantoprazole (PROTONIX) 40 MG tablet Take 40 mg by mouth daily.   rosuvastatin  (CRESTOR ) 20 MG tablet TAKE 1 TABLET(20 MG) BY MOUTH DAILY   RYBELSUS 7 MG TABS Take 1 tablet by mouth daily.   XIGDUO XR 02-999 MG TB24 Take 1 tablet by mouth 2 (two) times daily.   [DISCONTINUED] metFORMIN (GLUCOPHAGE) 1000 MG tablet Take 1 tablet by mouth 2 (two) times daily.   [DISCONTINUED] oxyCODONE -acetaminophen  (PERCOCET) 5-325 MG tablet Take 1 tablet by mouth every 4 (four) hours as needed for severe pain.     Allergies:   Patient has no known allergies.   Social History  Tobacco Use   Smoking status: Every Day    Current packs/day: 1.00    Average packs/day: 1 pack/day for 20.0 years (20.0 ttl pk-yrs)    Types: Cigarettes   Smokeless tobacco: Never   Tobacco comments:    cut back from 2 packs to 1  Vaping Use   Vaping status: Never Used  Substance Use Topics   Alcohol use: No   Drug use: No     Family Hx: The patient's family history includes Cancer in his father and sister; Heart attack in his paternal grandmother; Heart disease in his mother; Heart failure in his paternal uncle; Stroke in his paternal uncle. There is no history of Colon cancer.  ROS:   Please see the history of present  illness.     All other systems reviewed and are negative.   Prior CV studies:   The following studies were reviewed today:  Coronary CT angiography 12/12/2019:  FINDINGS: FFRct analysis was performed on the original cardiac CT angiogram dataset. Diagrammatic representation of the FFRct analysis is provided in a separate PDF document in PACS. This dictation was created using the PDF document and an interactive 3D model of the results. 3D model is not available in the EMR/PACS. Normal FFR range is >0.80.   1. Left Main: findings 0.99, 0.98 0.97   2. LAD: findings 0.96, 0.81 0.78 3. D1: Findings 0.95, 0.93, 0.78   4. LCX: findings 0.95, 0.94 0.88   5. OM1: findings 0.93, 0.91 0.90; 0.92   6. RCA: findings 0.99, 0.98 0.95   IMPRESSION: FFR abnormal in distal LAD and distal D1 (small caliber vessels); medical therapy likely best option unless refractory symptoms.  IMPRESSION: 1. No acute abnormality. 2. 4 mm peripheral nodule in the right lung. This small nodule is probably an incidental finding but indeterminate. No follow-up needed if patient is low-risk. Non-contrast chest CT can be considered in 12 months if patient is high-risk. This recommendation follows the consensus statement: Guidelines for Management of Incidental Pulmonary Nodules Detected on CT Images: From the Fleischner Society 2017; Radiology 2017; 284:228-243. 3.  Aortic Atherosclerosis (ICD10-I70.0).  Labs/Other Tests and Data Reviewed:    EKG:   11/06/2023 SR rate 100 normal   Recent Labs: No results found for requested labs within last 365 days.   Recent Lipid Panel Lab Results  Component Value Date/Time   CHOL 154 12/31/2014 08:31 AM   TRIG 133 12/31/2014 08:31 AM   HDL 30 (L) 12/31/2014 08:31 AM   CHOLHDL 5.1 (H) 12/31/2014 08:31 AM   CHOLHDL 4.6 03/14/2014 08:21 AM   LDLCALC 97 12/31/2014 08:31 AM    Wt Readings from Last 3 Encounters:  11/06/23 263 lb (119.3 kg)  06/08/22 275 lb (124.7  kg)  02/02/21 256 lb 12.8 oz (116.5 kg)     Objective:    Vital Signs:  BP 116/76 (BP Location: Right Arm, Patient Position: Sitting, Cuff Size: Large)   Pulse 100   Ht 5' 9 (1.753 m)   Wt 263 lb (119.3 kg)   SpO2 96%   BMI 38.84 kg/m    Affect appropriate Overweight chronically ill COPDer HEENT: normal Neck supple with no adenopathy JVP normal no bruits no thyromegaly Lungs clear with no wheezing and good diaphragmatic motion Heart:  S1/S2 no murmur, no rub, gallop or click PMI normal Abdomen: benighn, BS positve, no tenderness, no AAA no bruit.  No HSM or HJR Distal pulses intact with no bruits No edema Neuro non-focal Skin warm and  dry No muscular weakness  ASSESSMENT & PLAN:    1.  Coronary artery disease: small vessel disease in distal LAD and small distal D1 by CT/FFR 12/12/19 On ASA and statin stable no angina SL nitro  called in   2.  Hypertension: Blood pressure is normal.  No changes to therapy.  3.  Tobacco use disorder: He is smoking 1 pack of cigarettes daily.  I counseled him on the importance of cessation. Discussed use of Flonase during pollen season and called in Albuterol  inhaler for him   4.  Lung nodule: calcified granuloma RUL stable by CT 07/01/22  Update CT  5. HLD:  continue crestor    6. OSA/ wears CPAP with oxygen  at night  Discussed Inspiris device     Medication Adjustments/Labs and Tests Ordered: Current medicines are reviewed at length with the patient today.  Concerns regarding medicines are outlined above.   Tests Ordered: Orders Placed This Encounter  Procedures   EKG 12-Lead    Medication Changes: No orders of the defined types were placed in this encounter. CT lungs cancer screening   Follow Up:  In Person in 1 year(s)  Signed, Maude Emmer, MD  11/06/2023 11:09 AM    [facility]

## 2023-11-06 ENCOUNTER — Ambulatory Visit: Payer: Commercial Managed Care - PPO | Attending: Cardiovascular Disease | Admitting: Cardiovascular Disease

## 2023-11-06 ENCOUNTER — Encounter: Payer: Self-pay | Admitting: Cardiovascular Disease

## 2023-11-06 VITALS — BP 116/76 | HR 100 | Ht 69.0 in | Wt 263.0 lb

## 2023-11-06 DIAGNOSIS — G4733 Obstructive sleep apnea (adult) (pediatric): Secondary | ICD-10-CM | POA: Diagnosis not present

## 2023-11-06 DIAGNOSIS — I2510 Atherosclerotic heart disease of native coronary artery without angina pectoris: Secondary | ICD-10-CM

## 2023-11-06 DIAGNOSIS — Z720 Tobacco use: Secondary | ICD-10-CM | POA: Diagnosis not present

## 2023-11-06 DIAGNOSIS — I10 Essential (primary) hypertension: Secondary | ICD-10-CM

## 2023-11-06 NOTE — Patient Instructions (Signed)
 Medication Instructions:  Your physician recommends that you continue on your current medications as directed. Please refer to the Current Medication list given to you today.   Labwork: None today  Testing/Procedures: None today  Follow-Up: 1 year  Any Other Special Instructions Will Be Listed Below (If Applicable).  If you need a refill on your cardiac medications before your next appointment, please call your pharmacy.

## 2023-11-07 ENCOUNTER — Ambulatory Visit (HOSPITAL_COMMUNITY)
Admission: RE | Admit: 2023-11-07 | Discharge: 2023-11-07 | Disposition: A | Discharge status: Home / Self Care | Payer: Commercial Managed Care - PPO | Source: Ambulatory Visit | Attending: Family Medicine | Admitting: Family Medicine

## 2023-11-07 DIAGNOSIS — F17200 Nicotine dependence, unspecified, uncomplicated: Secondary | ICD-10-CM | POA: Diagnosis present

## 2024-07-04 ENCOUNTER — Encounter (INDEPENDENT_AMBULATORY_CARE_PROVIDER_SITE_OTHER): Payer: Self-pay | Admitting: *Deleted

## 2024-10-30 ENCOUNTER — Other Ambulatory Visit: Payer: Self-pay | Admitting: *Deleted

## 2024-10-30 DIAGNOSIS — Z122 Encounter for screening for malignant neoplasm of respiratory organs: Secondary | ICD-10-CM

## 2024-10-30 DIAGNOSIS — Z87891 Personal history of nicotine dependence: Secondary | ICD-10-CM

## 2024-10-30 DIAGNOSIS — F17210 Nicotine dependence, cigarettes, uncomplicated: Secondary | ICD-10-CM
